# Patient Record
Sex: Female | Born: 1965 | Race: White | Hispanic: No | Smoking: Never smoker
Health system: Southern US, Community
[De-identification: ages and names within clinical notes are randomized; demographics above are authoritative.]

## PROBLEM LIST (undated history)

## (undated) DIAGNOSIS — F419 Anxiety disorder, unspecified: Secondary | ICD-10-CM

## (undated) DIAGNOSIS — E611 Iron deficiency: Secondary | ICD-10-CM

## (undated) DIAGNOSIS — F410 Panic disorder [episodic paroxysmal anxiety] without agoraphobia: Secondary | ICD-10-CM

---

## 2003-03-07 ENCOUNTER — Emergency Department (HOSPITAL_COMMUNITY): Admission: AD | Admit: 2003-03-07 | Discharge: 2003-03-07 | Payer: Self-pay | Admitting: Family Medicine

## 2003-06-26 ENCOUNTER — Emergency Department (HOSPITAL_COMMUNITY): Admission: EM | Admit: 2003-06-26 | Discharge: 2003-06-26 | Payer: Self-pay | Admitting: *Deleted

## 2003-11-14 ENCOUNTER — Encounter: Admission: RE | Admit: 2003-11-14 | Discharge: 2003-11-14 | Payer: Self-pay | Admitting: Family Medicine

## 2004-08-19 ENCOUNTER — Other Ambulatory Visit: Admission: RE | Admit: 2004-08-19 | Discharge: 2004-08-19 | Payer: Self-pay | Admitting: Obstetrics and Gynecology

## 2004-12-24 ENCOUNTER — Encounter: Admission: RE | Admit: 2004-12-24 | Discharge: 2004-12-24 | Payer: Self-pay | Admitting: Family Medicine

## 2006-01-27 ENCOUNTER — Emergency Department (HOSPITAL_COMMUNITY): Admission: EM | Admit: 2006-01-27 | Discharge: 2006-01-27 | Payer: Self-pay | Admitting: Emergency Medicine

## 2006-03-20 ENCOUNTER — Emergency Department (HOSPITAL_COMMUNITY): Admission: EM | Admit: 2006-03-20 | Discharge: 2006-03-20 | Payer: Self-pay | Admitting: Emergency Medicine

## 2006-04-05 ENCOUNTER — Emergency Department (HOSPITAL_COMMUNITY): Admission: EM | Admit: 2006-04-05 | Discharge: 2006-04-05 | Payer: Self-pay | Admitting: Emergency Medicine

## 2006-07-24 ENCOUNTER — Emergency Department (HOSPITAL_COMMUNITY): Admission: EM | Admit: 2006-07-24 | Discharge: 2006-07-24 | Payer: Self-pay | Admitting: Family Medicine

## 2007-06-23 ENCOUNTER — Emergency Department (HOSPITAL_COMMUNITY): Admission: EM | Admit: 2007-06-23 | Discharge: 2007-06-23 | Payer: Self-pay | Admitting: Emergency Medicine

## 2007-08-21 ENCOUNTER — Emergency Department (HOSPITAL_COMMUNITY): Admission: EM | Admit: 2007-08-21 | Discharge: 2007-08-21 | Payer: Self-pay | Admitting: Emergency Medicine

## 2007-09-04 ENCOUNTER — Ambulatory Visit: Payer: Self-pay | Admitting: Family Medicine

## 2007-10-15 ENCOUNTER — Emergency Department (HOSPITAL_COMMUNITY): Admission: EM | Admit: 2007-10-15 | Discharge: 2007-10-15 | Payer: Self-pay | Admitting: Family Medicine

## 2007-12-10 ENCOUNTER — Emergency Department (HOSPITAL_COMMUNITY): Admission: EM | Admit: 2007-12-10 | Discharge: 2007-12-10 | Payer: Self-pay | Admitting: Family Medicine

## 2008-02-03 ENCOUNTER — Emergency Department (HOSPITAL_COMMUNITY): Admission: EM | Admit: 2008-02-03 | Discharge: 2008-02-03 | Payer: Self-pay | Admitting: Emergency Medicine

## 2008-06-17 ENCOUNTER — Emergency Department (HOSPITAL_COMMUNITY): Admission: EM | Admit: 2008-06-17 | Discharge: 2008-06-17 | Payer: Self-pay | Admitting: Emergency Medicine

## 2008-06-29 ENCOUNTER — Emergency Department (HOSPITAL_COMMUNITY): Admission: EM | Admit: 2008-06-29 | Discharge: 2008-06-29 | Payer: Self-pay | Admitting: Emergency Medicine

## 2008-07-10 ENCOUNTER — Emergency Department (HOSPITAL_COMMUNITY): Admission: EM | Admit: 2008-07-10 | Discharge: 2008-07-10 | Payer: Self-pay | Admitting: Emergency Medicine

## 2008-07-22 ENCOUNTER — Emergency Department (HOSPITAL_COMMUNITY): Admission: EM | Admit: 2008-07-22 | Discharge: 2008-07-23 | Payer: Self-pay | Admitting: Emergency Medicine

## 2008-07-28 ENCOUNTER — Inpatient Hospital Stay (HOSPITAL_COMMUNITY): Admission: AD | Admit: 2008-07-28 | Discharge: 2008-07-28 | Payer: Self-pay | Admitting: Obstetrics & Gynecology

## 2008-08-04 ENCOUNTER — Inpatient Hospital Stay (HOSPITAL_COMMUNITY): Admission: AD | Admit: 2008-08-04 | Discharge: 2008-08-04 | Payer: Self-pay | Admitting: Obstetrics & Gynecology

## 2008-08-21 ENCOUNTER — Encounter: Payer: Self-pay | Admitting: Obstetrics & Gynecology

## 2008-08-21 ENCOUNTER — Ambulatory Visit: Payer: Self-pay | Admitting: Obstetrics & Gynecology

## 2008-08-22 ENCOUNTER — Encounter: Payer: Self-pay | Admitting: Obstetrics & Gynecology

## 2008-08-22 LAB — CONVERTED CEMR LAB: hCG, Beta Chain, Quant, S: 6.7 milliintl units/mL

## 2008-08-26 ENCOUNTER — Emergency Department (HOSPITAL_COMMUNITY): Admission: EM | Admit: 2008-08-26 | Discharge: 2008-08-26 | Payer: Self-pay | Admitting: Emergency Medicine

## 2008-09-01 ENCOUNTER — Ambulatory Visit: Payer: Self-pay | Admitting: Obstetrics & Gynecology

## 2008-09-01 LAB — CONVERTED CEMR LAB: hCG, Beta Chain, Quant, S: 2 milliintl units/mL

## 2008-11-21 ENCOUNTER — Emergency Department (HOSPITAL_COMMUNITY): Admission: EM | Admit: 2008-11-21 | Discharge: 2008-11-21 | Payer: Self-pay | Admitting: Emergency Medicine

## 2009-06-17 ENCOUNTER — Emergency Department (HOSPITAL_COMMUNITY): Admission: EM | Admit: 2009-06-17 | Discharge: 2009-06-17 | Payer: Self-pay | Admitting: Family Medicine

## 2009-12-30 ENCOUNTER — Emergency Department (HOSPITAL_COMMUNITY): Admission: EM | Admit: 2009-12-30 | Discharge: 2009-12-30 | Payer: Self-pay | Admitting: Emergency Medicine

## 2010-03-08 ENCOUNTER — Encounter: Payer: Self-pay | Admitting: Family Medicine

## 2010-04-27 LAB — DIFFERENTIAL
Basophils Absolute: 0 10*3/uL (ref 0.0–0.1)
Basophils Relative: 0 % (ref 0–1)
Eosinophils Absolute: 0.1 10*3/uL (ref 0.0–0.7)
Eosinophils Relative: 1 % (ref 0–5)
Lymphocytes Relative: 41 % (ref 12–46)
Lymphs Abs: 3.5 10*3/uL (ref 0.7–4.0)
Monocytes Absolute: 0.4 10*3/uL (ref 0.1–1.0)
Monocytes Relative: 4 % (ref 3–12)
Neutro Abs: 4.5 10*3/uL (ref 1.7–7.7)
Neutrophils Relative %: 53 % (ref 43–77)

## 2010-04-27 LAB — CBC
HCT: 39.9 % (ref 36.0–46.0)
Hemoglobin: 13 g/dL (ref 12.0–15.0)
MCH: 25.9 pg — ABNORMAL LOW (ref 26.0–34.0)
MCHC: 32.6 g/dL (ref 30.0–36.0)
MCV: 79.5 fL (ref 78.0–100.0)
Platelets: 258 10*3/uL (ref 150–400)
RBC: 5.02 MIL/uL (ref 3.87–5.11)
RDW: 13.7 % (ref 11.5–15.5)
WBC: 8.6 10*3/uL (ref 4.0–10.5)

## 2010-04-27 LAB — BASIC METABOLIC PANEL
BUN: 11 mg/dL (ref 6–23)
CO2: 26 mEq/L (ref 19–32)
Calcium: 9.4 mg/dL (ref 8.4–10.5)
Chloride: 102 mEq/L (ref 96–112)
Creatinine, Ser: 0.74 mg/dL (ref 0.4–1.2)
GFR calc Af Amer: >60 mL/min (ref >60)
GFR calc non Af Amer: >60 mL/min (ref >60)
Glucose, Bld: 96 mg/dL (ref 70–99)
Potassium: 4.7 mEq/L (ref 3.5–5.1)
Sodium: 136 mEq/L (ref 135–145)

## 2010-04-27 LAB — URINALYSIS, ROUTINE W REFLEX MICROSCOPIC
Bilirubin Urine: NEGATIVE
Glucose, UA: NEGATIVE mg/dL
Hgb urine dipstick: NEGATIVE
Ketones, ur: NEGATIVE mg/dL
Nitrite: NEGATIVE
Protein, ur: NEGATIVE mg/dL
Specific Gravity, Urine: 1.023 (ref 1.005–1.030)
Urobilinogen, UA: 0.2 mg/dL (ref 0.0–1.0)
pH: 6 (ref 5.0–8.0)

## 2010-04-27 LAB — LIPASE, BLOOD: Lipase: 21 U/L (ref 11–59)

## 2010-04-27 LAB — PREGNANCY, URINE: Preg Test, Ur: NEGATIVE

## 2010-04-27 LAB — HEPATIC FUNCTION PANEL
ALT: 32 U/L (ref 0–35)
AST: 42 U/L — ABNORMAL HIGH (ref 0–37)
Albumin: 3.8 g/dL (ref 3.5–5.2)
Alkaline Phosphatase: 67 U/L (ref 39–117)
Bilirubin, Direct: 0.4 mg/dL — ABNORMAL HIGH (ref 0.0–0.3)
Indirect Bilirubin: 0.4 mg/dL (ref 0.3–0.9)
Total Bilirubin: 0.8 mg/dL (ref 0.3–1.2)
Total Protein: 7.3 g/dL (ref 6.0–8.3)

## 2010-05-04 LAB — POCT I-STAT, CHEM 8
BUN: 17 mg/dL (ref 6–23)
Calcium, Ion: 1.15 mmol/L (ref 1.12–1.32)
Chloride: 104 mEq/L (ref 96–112)
Creatinine, Ser: 0.6 mg/dL (ref 0.4–1.2)
Glucose, Bld: 104 mg/dL — ABNORMAL HIGH (ref 70–99)
HCT: 40 % (ref 36.0–46.0)
Hemoglobin: 13.6 g/dL (ref 12.0–15.0)
Potassium: 4.5 mEq/L (ref 3.5–5.1)
Sodium: 138 mEq/L (ref 135–145)
TCO2: 27 mmol/L (ref 0–100)

## 2010-05-04 LAB — POCT PREGNANCY, URINE: Preg Test, Ur: NEGATIVE

## 2010-05-24 LAB — URINALYSIS, ROUTINE W REFLEX MICROSCOPIC
Bilirubin Urine: NEGATIVE
Glucose, UA: NEGATIVE mg/dL
Hgb urine dipstick: NEGATIVE
Ketones, ur: NEGATIVE mg/dL
Nitrite: NEGATIVE
Protein, ur: NEGATIVE mg/dL
Specific Gravity, Urine: 1.031 — ABNORMAL HIGH (ref 1.005–1.030)
Urobilinogen, UA: 1 mg/dL (ref 0.0–1.0)
pH: 6 (ref 5.0–8.0)

## 2010-05-24 LAB — CBC
HCT: 35.2 % — ABNORMAL LOW (ref 36.0–46.0)
Hemoglobin: 11.4 g/dL — ABNORMAL LOW (ref 12.0–15.0)
MCHC: 32.5 g/dL (ref 30.0–36.0)
MCV: 82.4 fL (ref 78.0–100.0)
Platelets: 235 10*3/uL (ref 150–400)
RBC: 4.27 MIL/uL (ref 3.87–5.11)
RDW: 13.3 % (ref 11.5–15.5)
WBC: 7.5 10*3/uL (ref 4.0–10.5)

## 2010-05-24 LAB — HCG, QUANTITATIVE, PREGNANCY
hCG, Beta Chain, Quant, S: 49 m[IU]/mL — ABNORMAL HIGH (ref <5)
hCG, Beta Chain, Quant, S: 275 m[IU]/mL — ABNORMAL HIGH (ref <5)
hCG, Beta Chain, Quant, S: 116 m[IU]/mL — ABNORMAL HIGH (ref <5)

## 2010-05-25 LAB — POCT I-STAT, CHEM 8
BUN: 12 mg/dL (ref 6–23)
Calcium, Ion: 1.16 mmol/L (ref 1.12–1.32)
Creatinine, Ser: 0.9 mg/dL (ref 0.4–1.2)
Glucose, Bld: 127 mg/dL — ABNORMAL HIGH (ref 70–99)
Hemoglobin: 11.6 g/dL — ABNORMAL LOW (ref 12.0–15.0)
Sodium: 140 mEq/L (ref 135–145)
TCO2: 26 mmol/L (ref 0–100)

## 2010-05-25 LAB — URINALYSIS, ROUTINE W REFLEX MICROSCOPIC
Bilirubin Urine: NEGATIVE
Bilirubin Urine: NEGATIVE
Glucose, UA: NEGATIVE mg/dL
Glucose, UA: NEGATIVE mg/dL
Hgb urine dipstick: NEGATIVE
Ketones, ur: NEGATIVE mg/dL
Ketones, ur: NEGATIVE mg/dL
Nitrite: NEGATIVE
pH: 6.5 (ref 5.0–8.0)

## 2010-05-25 LAB — GC/CHLAMYDIA PROBE AMP, GENITAL
Chlamydia, DNA Probe: NEGATIVE
GC Probe Amp, Genital: NEGATIVE

## 2010-05-25 LAB — URINE MICROSCOPIC-ADD ON

## 2010-05-25 LAB — HCG, QUANTITATIVE, PREGNANCY: hCG, Beta Chain, Quant, S: 1460 m[IU]/mL — ABNORMAL HIGH (ref <5)

## 2010-05-25 LAB — POCT PREGNANCY, URINE: Preg Test, Ur: POSITIVE

## 2010-05-25 LAB — WET PREP, GENITAL
Trich, Wet Prep: NONE SEEN
Yeast Wet Prep HPF POC: NONE SEEN

## 2010-05-25 LAB — POCT CARDIAC MARKERS: Myoglobin, poc: 29.3 ng/mL (ref 12–200)

## 2010-05-25 LAB — ABO/RH: ABO/RH(D): A POS

## 2010-06-29 NOTE — Group Therapy Note (Signed)
NAME:  Rebecca Casey, NEISWONGER NO.:  [id number]   MEDICAL RECORD NO.:  [id number]          PATIENT TYPE:  WOC   LOCATION:  [facility]                   FACILITY:  [facility]   PHYSICIAN:  Jaynie Collins, MD     DATE OF BIRTH:  [birth year]   DATE OF SERVICE:  08/21/2008                                  CLINIC NOTE   CHIEF COMPLAINT:  Annual examination.   FOLLOWUP:  SAB.   HISTORY OF PRESENT ILLNESS:  The patient is a 45 year old gravida 3,  para 1-0-2-1 with a recent SAB whose last follow up at [facility] was on August 04, 2008.  Of note, during evaluation for this SAB, she had a probable  intrauterine gestational sac in the lower uterine segment but no yolk  sac or fetal poles were seen.  As such, she was serially followed with  HCG values.  Her beta HCG on August 04, 2008 was 49.  She was told to  follow up here for another lab test and also for her annual examination.  The patient denies any concerns.   PAST OB/GYN HISTORY:  One vaginal delivery, two miscarriages.   MENSTRUAL HISTORY:  Is remarkable for menarche at age 26 and regular  menstrual cycles with 30 days in between cycles and her periods last 3  days with medium flow associated with moderate pain.  The patient denies  abnormal Pap smears or sexually transmitted infections.  She is  currently not on any contraception.   PAST MEDICAL HISTORY:  Substance abuse.   PAST SURGICAL HISTORY:  None.   MEDICATIONS:  None.   ALLERGIES:  NO KNOWN DRUG ALLERGIES.   SOCIAL HISTORY:  The patient is currently in [facility] for  addition to crack and cocaine.  She does not smoke, drink alcohol or use  any illicit drugs currently.  She denies any current or past history of  sexual or physical abuse.   FAMILY HISTORY:  Is remarkable for various forms of cancer.  Her father  passed away with complications of colorectal cancer.  Her mother,  grandmother and great-grandmother had breast cancer, two sisters were  diagnosed with  skin cancer.  There is also a history of Crohn's colitis  for one of her sisters.  The patient is unsure if any genetics  evaluation has been done.  There is also a family history of high blood  pressure.   REVIEW OF SYSTEMS:  The patient endorses weight gain.   PHYSICAL EXAMINATION:  VITAL SIGNS:  Temperature 98, pulse 81, blood  pressure 102/70, respirations 20, weight 167 pounds.  GENERAL:  In no apparent distress.  HEENT:  Normocephalic, atraumatic.  NECK:  Supple.  Normal thyroid.  BREASTS:  Symmetric in size.  No abnormal masses, skin changes,  tenderness on lymphadenopathy.  LUNGS:  Clear to auscultation bilaterally.  HEART:  Regular rate and rhythm.  ABDOMEN:  Soft, nontender, nondistended.  EXTREMITIES:  No clubbing, cyanosis or edema.  PELVIC:  Normal external female genitalia.  Pink, well rugated vagina.  Multiparous cervical os.  Pap smear was obtained.  A small amount of old  blood visualized  on the external cervical os.  On bimanual exam, small  normal-sized uterus, mobile, no adnexal or uterine tenderness.  Normal  adnexa bilaterally.   ASSESSMENT/PLAN:  The patient is a 45 year old gravida 3, para 1-0-2-1  here for annual exam.  The patient is also here for spontaneous abortion  follow up.  She had a normal breast examination today and given her  extensive family history of breast cancer, will be scheduled for  mammogram at the end of this visit.  Pap smear will also be followed up  and the patient will be contacted with the results.  Given that her beta  HCGs were not followed down to where they were undetectable, another  level will be drawn today and the patient will also be called with the  results.  The patient was told to return to the gynecologic clinic for  any further gynecologic concerns.           ______________________________  Jaynie Collins, MD     UA/MEDQ  D:  08/21/2008  T:  08/21/2008  Job:  [id number]

## 2010-11-19 LAB — POCT I-STAT, CHEM 8
BUN: 15 mg/dL (ref 6–23)
Calcium, Ion: 1.21 mmol/L (ref 1.12–1.32)
Creatinine, Ser: 0.9 mg/dL (ref 0.4–1.2)
Sodium: 143 mEq/L (ref 135–145)
TCO2: 26 mmol/L (ref 0–100)

## 2010-11-19 LAB — URINALYSIS, ROUTINE W REFLEX MICROSCOPIC
Glucose, UA: NEGATIVE mg/dL
Protein, ur: NEGATIVE mg/dL
Specific Gravity, Urine: 1.025 (ref 1.005–1.030)

## 2010-11-19 LAB — DIFFERENTIAL
Basophils Relative: 1 % (ref 0–1)
Eosinophils Absolute: 0.1 10*3/uL (ref 0.0–0.7)
Eosinophils Relative: 1 % (ref 0–5)
Monocytes Absolute: 0.3 10*3/uL (ref 0.1–1.0)
Monocytes Relative: 5 % (ref 3–12)

## 2010-11-19 LAB — PREGNANCY, URINE: Preg Test, Ur: NEGATIVE

## 2010-11-19 LAB — COMPREHENSIVE METABOLIC PANEL
ALT: 13 U/L (ref 0–35)
AST: 17 U/L (ref 0–37)
Albumin: 3.1 g/dL — ABNORMAL LOW (ref 3.5–5.2)
Alkaline Phosphatase: 41 U/L (ref 39–117)
Potassium: 4 mEq/L (ref 3.5–5.1)
Sodium: 143 mEq/L (ref 135–145)
Total Protein: 5.6 g/dL — ABNORMAL LOW (ref 6.0–8.3)

## 2010-11-19 LAB — RAPID URINE DRUG SCREEN, HOSP PERFORMED
Benzodiazepines: NOT DETECTED
Cocaine: POSITIVE — AB
Tetrahydrocannabinol: NOT DETECTED

## 2010-11-19 LAB — POCT CARDIAC MARKERS
Myoglobin, poc: 33.3 ng/mL (ref 12–200)
Myoglobin, poc: 57.1 ng/mL (ref 12–200)

## 2010-11-19 LAB — CBC
Hemoglobin: 11.9 g/dL — ABNORMAL LOW (ref 12.0–15.0)
Platelets: 213 10*3/uL (ref 150–400)
RDW: 13.6 % (ref 11.5–15.5)
WBC: 6.7 10*3/uL (ref 4.0–10.5)

## 2010-11-19 LAB — URINE CULTURE

## 2010-11-19 LAB — ETHANOL: Alcohol, Ethyl (B): <5 mg/dL (ref 0–10)

## 2011-07-12 IMAGING — CT CT ABD-PELV W/ CM
2 of 5 series · 17 of 46 positions shown, 19 images · IV contrast (CONTRAST)
Comparison: None

CLINICAL DATA: Severe right flank and right upper quadrant pain.
Nausea, vomiting.

CT ABDOMEN AND PELVIS WITH CONTRAST
TECHNIQUE: Multidetector CT imaging of the abdomen and pelvis was
performed following the standard protocol during bolus
administration of intravenous contrast.
Contrast: 100 ml Omnipaque 300 IV.

[Series 2: routine · axial · 0.73mm/px · z∈[-236,+159]mm · 14 of 91 slices shown, 16 images]
[im 6/91  soft-tissue]
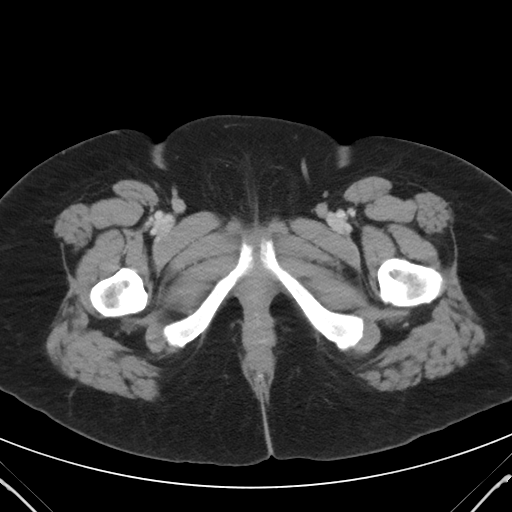
[im 6/91  bone]
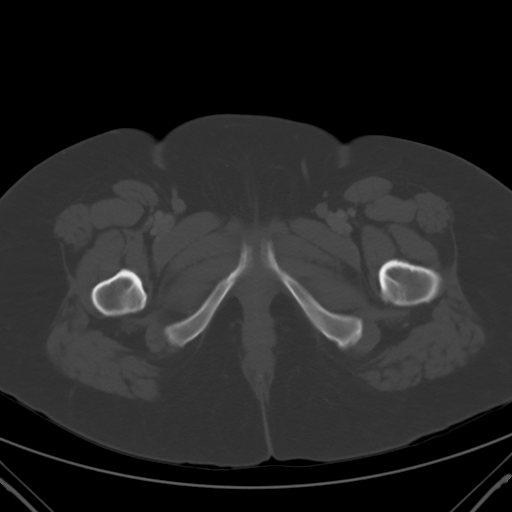
[im 12/91  soft-tissue]
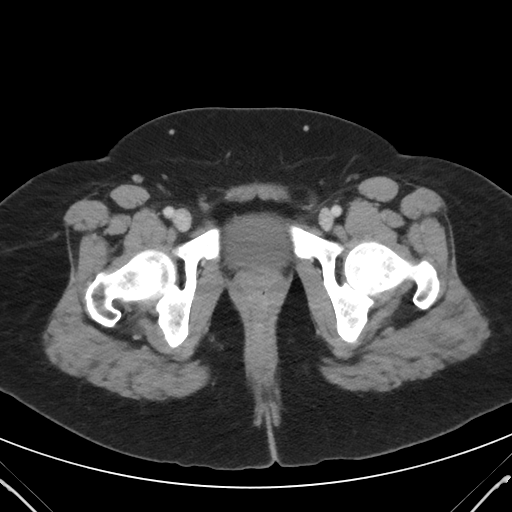
[im 17/91  soft-tissue]
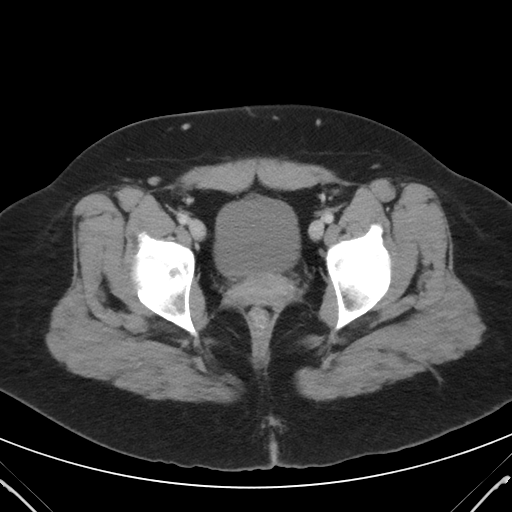
[im 23/91  soft-tissue]
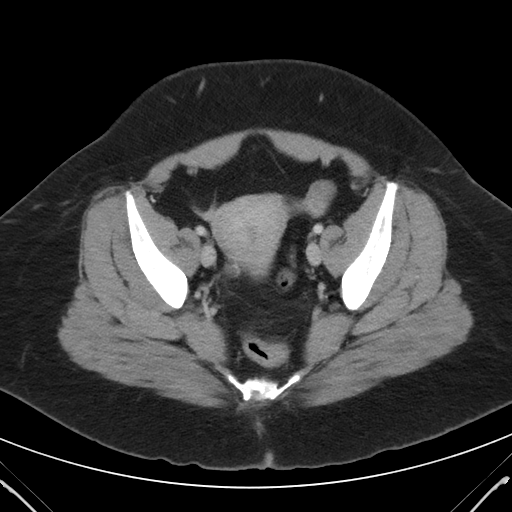
[im 29/91  soft-tissue]
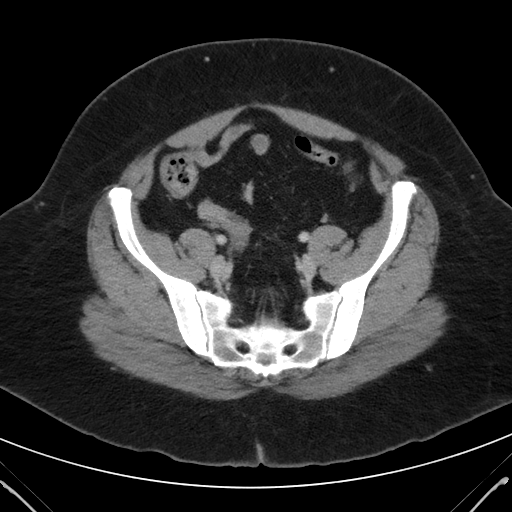
[im 34/91  soft-tissue]
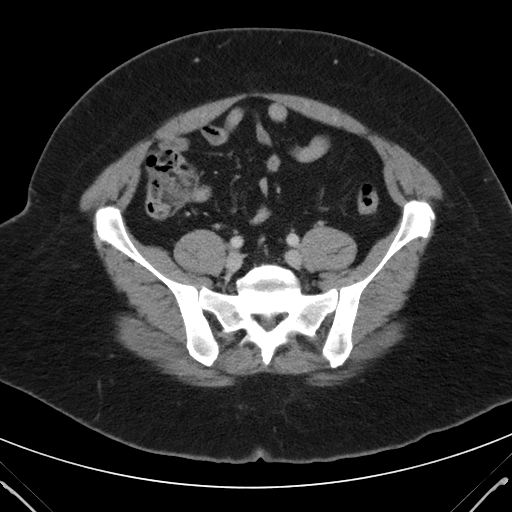
[im 40/91  soft-tissue]
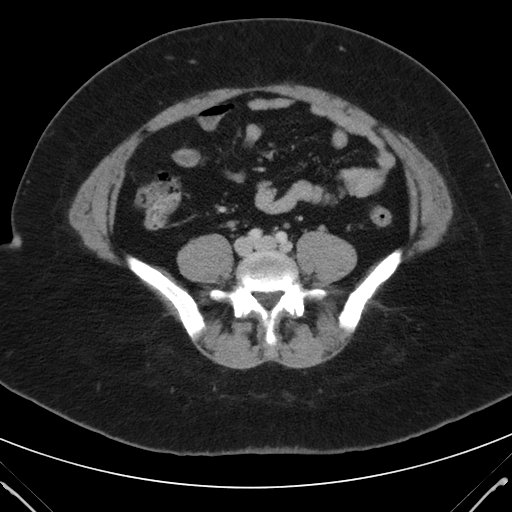
[im 51/91  soft-tissue]
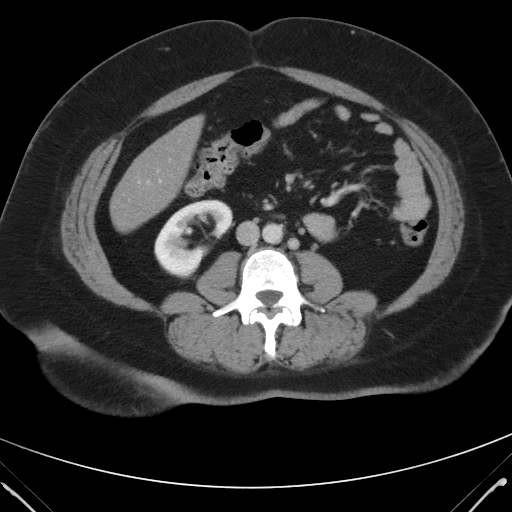
[im 57/91  soft-tissue]
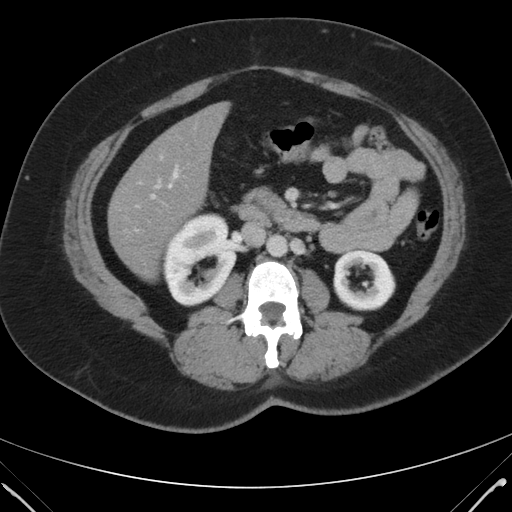
[im 57/91  bone]
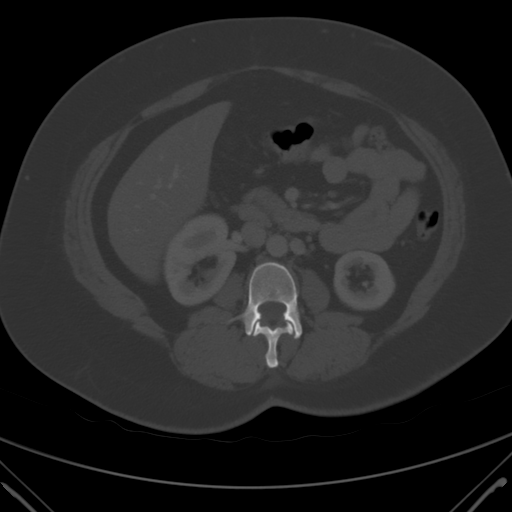
[im 62/91  soft-tissue]
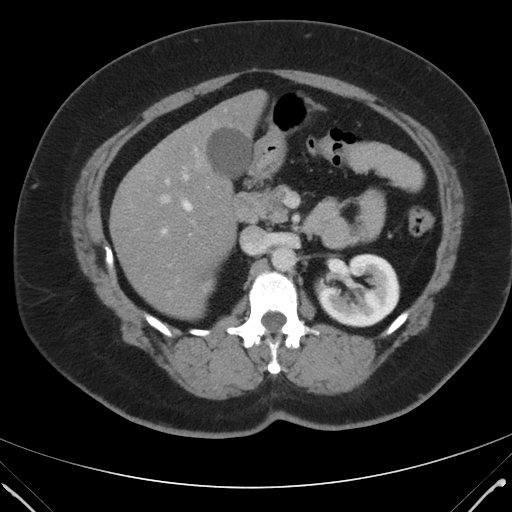
[im 68/91  soft-tissue]
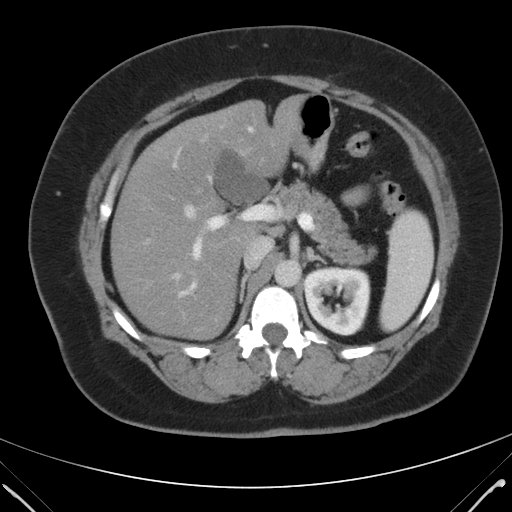
[im 74/91  soft-tissue]
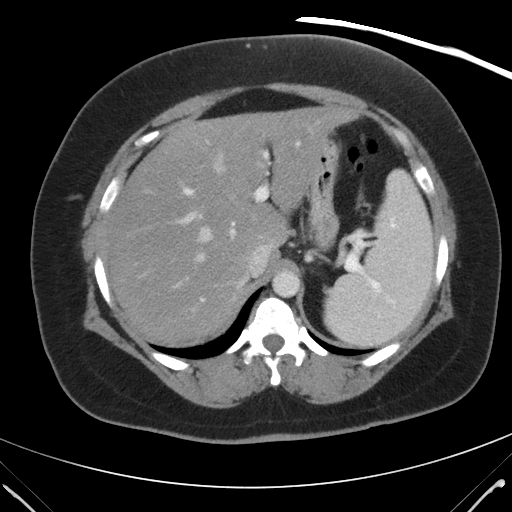
[im 79/91  soft-tissue]
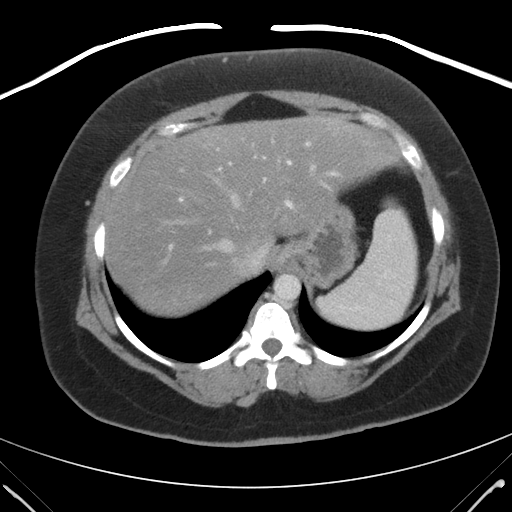
[im 85/91  soft-tissue]
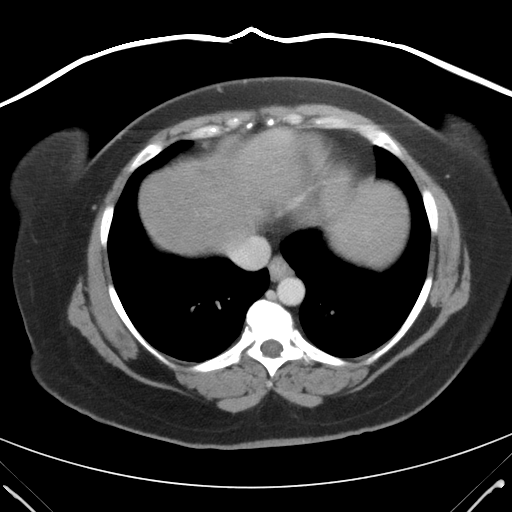

[mpr, coronals, coronal · coronal · 0.88mm/px · 3 of 128 slices shown]
[im 43/128  soft-tissue]
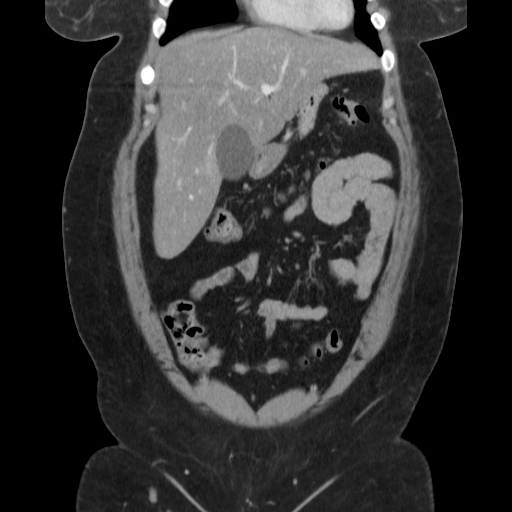
[im 57/128  soft-tissue]
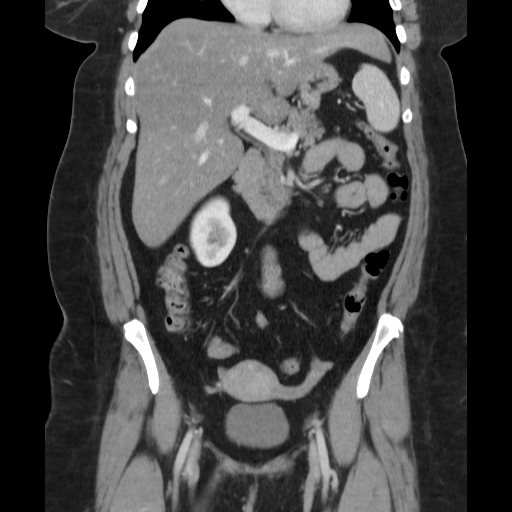
[im 71/128  soft-tissue]
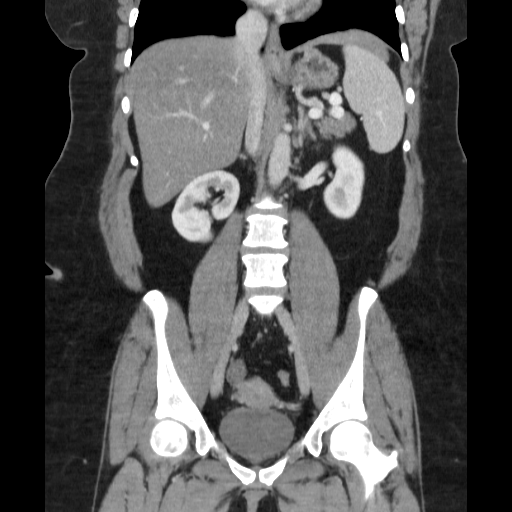

[17 of 46 positions shown; findings below may reference images not displayed]

FINDINGS: Lung bases are clear.  No effusions.  Heart is normal
size.

There is fatty infiltration of the liver.  No focal lesion.
Gallbladder, spleen, pancreas, adrenals and kidneys are
unremarkable.  The appendix is visualized and is normal. Bowel
grossly unremarkable.  No free fluid, free air, or adenopathy.
Uterus, adnexa and urinary bladder unremarkable.

No acute bony abnormality.
IMPRESSION: Fatty infiltration of the liver.

No acute findings in the abdomen or pelvis.

## 2012-08-31 ENCOUNTER — Other Ambulatory Visit: Payer: Self-pay

## 2012-08-31 DIAGNOSIS — Z1231 Encounter for screening mammogram for malignant neoplasm of breast: Secondary | ICD-10-CM

## 2012-09-12 ENCOUNTER — Telehealth: Payer: Self-pay | Admitting: *Deleted
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
## 2012-09-12 NOTE — Telephone Encounter (Signed)
Ok that sounds reasonable.

## 2012-09-21 ENCOUNTER — Ambulatory Visit
Admission: RE | Admit: 2012-09-21 | Discharge: 2012-09-21 | Disposition: A | Discharge status: Home / Self Care | Payer: Medicaid Other | Source: Ambulatory Visit

## 2012-09-21 DIAGNOSIS — Z1231 Encounter for screening mammogram for malignant neoplasm of breast: Secondary | ICD-10-CM

## 2012-09-25 ENCOUNTER — Other Ambulatory Visit: Payer: Self-pay | Admitting: Internal Medicine

## 2012-09-25 DIAGNOSIS — R928 Other abnormal and inconclusive findings on diagnostic imaging of breast: Secondary | ICD-10-CM

## 2012-10-12 ENCOUNTER — Other Ambulatory Visit: Payer: Medicaid Other

## 2012-10-22 ENCOUNTER — Ambulatory Visit: Payer: Self-pay | Admitting: Infectious Disease

## 2012-11-27 ENCOUNTER — Other Ambulatory Visit: Payer: Self-pay | Admitting: Internal Medicine

## 2012-11-27 DIAGNOSIS — R928 Other abnormal and inconclusive findings on diagnostic imaging of breast: Secondary | ICD-10-CM

## 2012-12-11 ENCOUNTER — Other Ambulatory Visit: Payer: Medicaid Other

## 2012-12-20 ENCOUNTER — Ambulatory Visit
Admission: RE | Admit: 2012-12-20 | Discharge: 2012-12-20 | Disposition: A | Discharge status: Home / Self Care | Payer: Medicaid Other | Source: Ambulatory Visit | Attending: Internal Medicine | Admitting: Internal Medicine

## 2012-12-20 ENCOUNTER — Other Ambulatory Visit: Payer: Self-pay | Admitting: Internal Medicine

## 2012-12-20 DIAGNOSIS — R928 Other abnormal and inconclusive findings on diagnostic imaging of breast: Secondary | ICD-10-CM

## 2013-05-06 ENCOUNTER — Emergency Department (HOSPITAL_COMMUNITY)
Admission: EM | Admit: 2013-05-06 | Discharge: 2013-05-07 | Disposition: A | Discharge status: Home / Self Care | Payer: Medicaid Other | Attending: Emergency Medicine | Admitting: Emergency Medicine

## 2013-05-06 ENCOUNTER — Encounter (HOSPITAL_COMMUNITY): Payer: Self-pay | Admitting: Emergency Medicine

## 2013-05-06 ENCOUNTER — Emergency Department (HOSPITAL_COMMUNITY): Payer: Medicaid Other

## 2013-05-06 DIAGNOSIS — R071 Chest pain on breathing: Secondary | ICD-10-CM | POA: Insufficient documentation

## 2013-05-06 DIAGNOSIS — R0789 Other chest pain: Secondary | ICD-10-CM

## 2013-05-06 DIAGNOSIS — F329 Major depressive disorder, single episode, unspecified: Secondary | ICD-10-CM

## 2013-05-06 DIAGNOSIS — F419 Anxiety disorder, unspecified: Secondary | ICD-10-CM

## 2013-05-06 DIAGNOSIS — F410 Panic disorder [episodic paroxysmal anxiety] without agoraphobia: Secondary | ICD-10-CM | POA: Insufficient documentation

## 2013-05-06 DIAGNOSIS — M25559 Pain in unspecified hip: Secondary | ICD-10-CM | POA: Insufficient documentation

## 2013-05-06 DIAGNOSIS — F3289 Other specified depressive episodes: Secondary | ICD-10-CM | POA: Insufficient documentation

## 2013-05-06 DIAGNOSIS — D509 Iron deficiency anemia, unspecified: Secondary | ICD-10-CM | POA: Insufficient documentation

## 2013-05-06 DIAGNOSIS — F32A Depression, unspecified: Secondary | ICD-10-CM

## 2013-05-06 HISTORY — DX: Iron deficiency: E61.1

## 2013-05-06 HISTORY — DX: Anxiety disorder, unspecified: F41.9

## 2013-05-06 HISTORY — DX: Panic disorder (episodic paroxysmal anxiety): F41.0

## 2013-05-06 LAB — CBC
HCT: 38.7 % (ref 36.0–46.0)
HEMOGLOBIN: 12.7 g/dL (ref 12.0–15.0)
MCH: 27.2 pg (ref 26.0–34.0)
MCHC: 32.8 g/dL (ref 30.0–36.0)
MCV: 82.9 fL (ref 78.0–100.0)
PLATELETS: 251 10*3/uL (ref 150–400)
RBC: 4.67 MIL/uL (ref 3.87–5.11)
RDW: 13.2 % (ref 11.5–15.5)
WBC: 8.4 10*3/uL (ref 4.0–10.5)

## 2013-05-06 LAB — I-STAT TROPONIN, ED: TROPONIN I, POC: 0 ng/mL (ref 0.00–0.08)

## 2013-05-06 LAB — BASIC METABOLIC PANEL
BUN: 12 mg/dL (ref 6–23)
CHLORIDE: 103 meq/L (ref 96–112)
CO2: 28 meq/L (ref 19–32)
Calcium: 9.5 mg/dL (ref 8.4–10.5)
Creatinine, Ser: 0.7 mg/dL (ref 0.50–1.10)
GFR calc Af Amer: >90 mL/min (ref >90)
GFR calc non Af Amer: >90 mL/min (ref >90)
GLUCOSE: 113 mg/dL — AB (ref 70–99)
POTASSIUM: 5 meq/L (ref 3.7–5.3)
SODIUM: 141 meq/L (ref 137–147)

## 2013-05-06 NOTE — ED Notes (Signed)
Pt updated on wait time.  Agreed to wait.

## 2013-05-06 NOTE — ED Notes (Signed)
Pt c/o bilateral hip/thigh pain X 1 month, sts the pain is getting worse and it causes her to become anxious and today she started to have an anxiety attack that caused CP. Pt tearful in triage, sts she has a lot of stress going on in her life right now and just can't handle it. Pt reports she can only eat every 2 days because she is so stressed. Denies difficulty ambulating. Denies SI/HI. Nad, skin warm and dry, resp e/u.

## 2013-05-07 MED ORDER — NAPROXEN 500 MG PO TABS: 500.0000 mg | ORAL_TABLET | Freq: Two times a day (BID) | ORAL | Status: AC

## 2013-05-07 MED ORDER — NAPROXEN 250 MG PO TABS
500.0000 mg | ORAL_TABLET | Freq: Once | ORAL | Status: AC
  Administered 2013-05-07: 500 mg via ORAL
  Filled 2013-05-07: qty 2

## 2013-05-07 MED ORDER — LORAZEPAM 1 MG PO TABS: 1.0000 mg | ORAL_TABLET | Freq: Three times a day (TID) | ORAL | Status: AC | PRN

## 2013-05-07 NOTE — Discharge Instructions (Signed)
It is extremely important for you to followup with a local mental health provider to help with your ongoing symptoms.  Starting on a long-term medication will help with many of your symptoms.   Arthralgia Your caregiver has diagnosed you as suffering from an arthralgia. Arthralgia means there is pain in a joint. This can come from many reasons including:  Bruising the joint which causes soreness (inflammation) in the joint.  Wear and tear on the joints which occur as we grow older (osteoarthritis).  Overusing the joint.  Various forms of arthritis.  Infections of the joint. Regardless of the cause of pain in your joint, most of these different pains respond to anti-inflammatory drugs and rest. The exception to this is when a joint is infected, and these cases are treated with antibiotics, if it is a bacterial infection. HOME CARE INSTRUCTIONS   Rest the injured area for as long as directed by your caregiver. Then slowly start using the joint as directed by your caregiver and as the pain allows. Crutches as directed may be useful if the ankles, knees or hips are involved. If the knee was splinted or casted, continue use and care as directed. If an stretchy or elastic wrapping bandage has been applied today, it should be removed and re-applied every 3 to 4 hours. It should not be applied tightly, but firmly enough to keep swelling down. Watch toes and feet for swelling, bluish discoloration, coldness, numbness or excessive pain. If any of these problems (symptoms) occur, remove the ace bandage and re-apply more loosely. If these symptoms persist, contact your caregiver or return to this location.  For the first 24 hours, keep the injured extremity elevated on pillows while lying down.  Apply ice for 15-20 minutes to the sore joint every couple hours while awake for the first half day. Then 03-04 times per day for the first 48 hours. Put the ice in a plastic bag and place a towel between the bag  of ice and your skin.  Wear any splinting, casting, elastic bandage applications, or slings as instructed.  Only take over-the-counter or prescription medicines for pain, discomfort, or fever as directed by your caregiver. Do not use aspirin immediately after the injury unless instructed by your physician. Aspirin can cause increased bleeding and bruising of the tissues.  If you were given crutches, continue to use them as instructed and do not resume weight bearing on the sore joint until instructed. Persistent pain and inability to use the sore joint as directed for more than 2 to 3 days are warning signs indicating that you should see a caregiver for a follow-up visit as soon as possible. Initially, a hairline fracture (break in bone) may not be evident on X-rays. Persistent pain and swelling indicate that further evaluation, non-weight bearing or use of the joint (use of crutches or slings as instructed), or further X-rays are indicated. X-rays may sometimes not show a small fracture until a week or 10 days later. Make a follow-up appointment with your own caregiver or one to whom we have referred you. A radiologist (specialist in reading X-rays) may read your X-rays. Make sure you know how you are to obtain your X-ray results. Do not assume everything is normal if you do not hear from us. SEEK MEDICAL CARE IF: Bruising, swelling, or pain increases. SEEK IMMEDIATE MEDICAL CARE IF:   Your fingers or toes are numb or blue.  The pain is not responding to medications and continues to stay the same  or get worse.  The pain in your joint becomes severe.  You develop a fever over 102 F (38.9 C).  It becomes impossible to move or use the joint. MAKE SURE YOU:   Understand these instructions.  Will watch your condition.  Will get help right away if you are not doing well or get worse. Document Released: 01/31/2005 Document Revised: 04/25/2011 Document Reviewed: 09/19/2007 [facility] Patient  Information 2014 [place], [place].  Chest Wall Pain Chest wall pain is pain in or around the bones and muscles of your chest. It may take up to 6 weeks to get better. It may take longer if you must stay physically active in your work and activities.  CAUSES  Chest wall pain may happen on its own. However, it may be caused by:  A viral illness like the flu.  Injury.  Coughing.  Exercise.  Arthritis.  Fibromyalgia.  Shingles. HOME CARE INSTRUCTIONS   Avoid overtiring physical activity. Try not to strain or perform activities that cause pain. This includes any activities using your chest or your abdominal and side muscles, especially if heavy weights are used.  Put ice on the sore area.  Put ice in a plastic bag.  Place a towel between your skin and the bag.  Leave the ice on for 15-20 minutes per hour while awake for the first 2 days.  Only take over-the-counter or prescription medicines for pain, discomfort, or fever as directed by your caregiver. SEEK IMMEDIATE MEDICAL CARE IF:   Your pain increases, or you are very uncomfortable.  You have a fever.  Your chest pain becomes worse.  You have new, unexplained symptoms.  You have nausea or vomiting.  You feel sweaty or lightheaded.  You have a cough with phlegm (sputum), or you cough up blood. MAKE SURE YOU:   Understand these instructions.  Will watch your condition.  Will get help right away if you are not doing well or get worse. Document Released: 01/31/2005 Document Revised: 04/25/2011 Document Reviewed: 09/27/2010 [facility] Patient Information 2014 [place], [place].  Depression, Adult Depression refers to feeling sad, low, down in the dumps, blue, gloomy, or empty. In general, there are two kinds of depression: 1. Depression that we all experience from time to time because of upsetting life experiences, including the loss of a job or the ending of a relationship (normal sadness or normal grief). This kind of  depression is considered normal, is short lived, and resolves within a few days to 2 weeks. (Depression experienced after the loss of a loved one is called bereavement. Bereavement often lasts longer than 2 weeks but normally gets better with time.) 2. Clinical depression, which lasts longer than normal sadness or normal grief or interferes with your ability to function at home, at work, and in school. It also interferes with your personal relationships. It affects almost every aspect of your life. Clinical depression is an illness. Symptoms of depression also can be caused by conditions other than normal sadness and grief or clinical depression. Examples of these conditions are listed as follows:  Physical illness Some physical illnesses, including underactive thyroid gland (hypothyroidism), severe anemia, specific types of cancer, diabetes, uncontrolled seizures, heart and lung problems, strokes, and chronic pain are commonly associated with symptoms of depression.  Side effects of some prescription medicine In some people, certain types of prescription medicine can cause symptoms of depression.  Substance abuse Abuse of alcohol and illicit drugs can cause symptoms of depression. SYMPTOMS Symptoms of normal sadness and  normal grief include the following:  Feeling sad or crying for short periods of time.  Not caring about anything (apathy).  Difficulty sleeping or sleeping too much.  No longer able to enjoy the things you used to enjoy.  Desire to be by oneself all the time (social isolation).  Lack of energy or motivation.  Difficulty concentrating or remembering.  Change in appetite or weight.  Restlessness or agitation. Symptoms of clinical depression include the same symptoms of normal sadness or normal grief and also the following symptoms:  Feeling sad or crying all the time.  Feelings of guilt or worthlessness.  Feelings of hopelessness or helplessness.  Thoughts of  suicide or the desire to harm yourself (suicidal ideation).  Loss of touch with reality (psychotic symptoms). Seeing or hearing things that are not real (hallucinations) or having false beliefs about your life or the people around you (delusions and paranoia). DIAGNOSIS  The diagnosis of clinical depression usually is based on the severity and duration of the symptoms. Your caregiver also will ask you questions about your medical history and substance use to find out if physical illness, use of prescription medicine, or substance abuse is causing your depression. Your caregiver also may order blood tests. TREATMENT  Typically, normal sadness and normal grief do not require treatment. However, sometimes antidepressant medicine is prescribed for bereavement to ease the depressive symptoms until they resolve. The treatment for clinical depression depends on the severity of your symptoms but typically includes antidepressant medicine, counseling with a mental health professional, or a combination of both. Your caregiver will help to determine what treatment is best for you. Depression caused by physical illness usually goes away with appropriate medical treatment of the illness. If prescription medicine is causing depression, talk with your caregiver about stopping the medicine, decreasing the dose, or substituting another medicine. Depression caused by abuse of alcohol or illicit drugs abuse goes away with abstinence from these substances. Some adults need professional help in order to stop drinking or using drugs. SEEK IMMEDIATE CARE IF:  You have thoughts about hurting yourself or others.  You lose touch with reality (have psychotic symptoms).  You are taking medicine for depression and have a serious side effect. FOR MORE INFORMATION National Alliance on Mental Illness: www.nami.[employer] of Mental Health: http://www.maynard.net/ Document Released: 01/29/2000 Document Revised: 08/02/2011  Document Reviewed: 05/02/2011 [facility] Patient Information 2014 [place], [place].  Panic Attacks Panic attacks are sudden, short-livedsurges of severe anxiety, fear, or discomfort. They may occur for no reason when you are relaxed, when you are anxious, or when you are sleeping. Panic attacks may occur for a number of reasons:   Healthy people occasionally have panic attacks in extreme, life-threatening situations, such as war or natural disasters. Normal anxiety is a protective mechanism of the body that helps [place] react to danger (fight or flight response).  Panic attacks are often seen with anxiety disorders, such as panic disorder, social anxiety disorder, generalized anxiety disorder, and phobias. Anxiety disorders cause excessive or uncontrollable anxiety. They may interfere with your relationships or other life activities.  Panic attacks are sometimes seen with other mental illnesses such as depression and posttraumatic stress disorder.  Certain medical conditions, prescription medicines, and drugs of abuse can cause panic attacks. SYMPTOMS  Panic attacks start suddenly, peak within 20 minutes, and are accompanied by four or more of the following symptoms:  Pounding heart or fast heart rate (palpitations).  Sweating.  Trembling or shaking.  Shortness of breath or  feeling smothered.  Feeling choked.  Chest pain or discomfort.  Nausea or strange feeling in your stomach.  Dizziness, lightheadedness, or feeling like you will faint.  Chills or hot flushes.  Numbness or tingling in your lips or hands and feet.  Feeling that things are not real or feeling that you are not yourself.  Fear of losing control or going crazy.  Fear of dying. Some of these symptoms can mimic serious medical conditions. For example, you may think you are having a heart attack. Although panic attacks can be very scary, they are not life threatening. DIAGNOSIS  Panic attacks are diagnosed through an  assessment by your health care provider. Your health care provider will ask questions about your symptoms, such as where and when they occurred. Your health care provider will also ask about your medical history and use of alcohol and drugs, including prescription medicines. Your health care provider may order blood tests or other studies to rule out a serious medical condition. Your health care provider may refer you to a mental health professional for further evaluation. TREATMENT   Most healthy people who have one or two panic attacks in an extreme, life-threatening situation will not require treatment.  The treatment for panic attacks associated with anxiety disorders or other mental illness typically involves counseling with a mental health professional, medicine, or a combination of both. Your health care provider will help determine what treatment is best for you.  Panic attacks due to physical illness usually goes away with treatment of the illness. If prescription medicine is causing panic attacks, talk with your health care provider about stopping the medicine, decreasing the dose, or substituting another medicine.  Panic attacks due to alcohol or drug abuse goes away with abstinence. Some adults need professional help in order to stop drinking or using drugs. HOME CARE INSTRUCTIONS   Take all your medicines as prescribed.   Check with your health care provider before starting new prescription or over-the-counter medicines.  Keep all follow up appointments with your health care provider. SEEK MEDICAL CARE IF:  You are not able to take your medicines as prescribed.  Your symptoms do not improve or get worse. SEEK IMMEDIATE MEDICAL CARE IF:   You experience panic attack symptoms that are different than your usual symptoms.  You have serious thoughts about hurting yourself or others.  You are taking medicine for panic attacks and have a serious side effect. MAKE SURE  YOU:  Understand these instructions.  Will watch your condition.  Will get help right away if you are not doing well or get worse. Document Released: 01/31/2005 Document Revised: 11/21/2012 Document Reviewed: 09/14/2012 [facility] Patient Information 2014 [place], [place].    Emergency Department Resource Guide 1) Find a Doctor and Pay Out of Pocket Although you won't have to find out who is covered by your insurance plan, it is a good idea to ask around and get recommendations. You will then need to call the office and see if the doctor you have chosen will accept you as a new patient and what types of options they offer for patients who are self-pay. Some doctors offer discounts or will set up payment plans for their patients who do not have insurance, but you will need to ask so you aren't surprised when you get to your appointment.  2) Contact Your Local Health Department Not all health departments have doctors that can see patients for sick visits, but many do, so it is worth a call  to see if yours does. If you don't know where your local health department is, you can check in your phone book. The CDC also has a tool to help you locate your state's health department, and many state websites also have listings of all of their local health departments.  3) Find a Walk-in Clinic If your illness is not likely to be very severe or complicated, you may want to try a walk in clinic. These are popping up all over the country in pharmacies, drugstores, and shopping centers. They're usually staffed by nurse practitioners or physician assistants that have been trained to treat common illnesses and complaints. They're usually fairly quick and inexpensive. However, if you have serious medical issues or chronic medical problems, these are probably not your best option.  No Primary Care Doctor: - Call Health Connect at  [phone] - they can help you locate a primary care doctor that  accepts your insurance,  provides certain services, etc. - Physician Referral Service- [phone]  Chronic Pain Problems: Organization         Address  Phone   Notes  [facility]  [phone] Patients need to be referred by their primary care doctor.   Medication Assistance: Organization         Address  Phone   Notes  [facility] [address] [place] [place], [place] [address] [phone] --Must be a resident of [facility] [place] [facility] -- Must have NO insurance coverage whatsoever (no Medicaid/ Medicare, etc.) -- The pt. MUST have a primary care doctor that directs their care regularly and follows them in the community   [facility]  [phone]   [employer]  [phone]    Agencies that provide inexpensive medical care: Organization         Address  Phone   Notes  [facility]  [phone]   [facility]    [phone]   [facility] [address] [place], [place] [address] [phone]   [facility] [address] [place] [phone]   Planned Parenthood    [phone]   [facility]    [phone]   [facility]  [address], [place] Phone:  [phone], Fax:  [phone] Hours of Operation:  9 am - 6 pm, M-F.  Also accepts Medicaid/Medicare and self-pay.  [facility]  [address], [place] Phone: [phone], Fax: [phone]. Hours of Operation:  8:30 am - 5:30 pm, M-F.  Also accepts Medicaid and self-pay.  [facility] [place] [address], [place] Phone: [phone]   [facility] [address] Natasha Bence [place], [place] [phone], Ext. [phone] Mondays & Thursdays: 7-9 AM.  First 15 patients are seen on a first come, first serve basis.    Medicaid-accepting [facility] Providers:  Organization         Address  Phone    Notes  [facility] [address],  [phone] Also accepts self-pay patients.  [facility] [address] Laurell Josephs [place], [place]  [phone]   [facility] [address], [place] [phone]   [facility] [address] [place] [phone]   Renaye Rakers [address], [place]   [phone]  [phone]) [phone] Only accepts [place] Access Medicaid patients after they have their name applied to their card.   Self-Pay (no insurance) in [facility]:  Organization         Address  Phone   Notes  Sickle Cell Patients, [facility] [address], [place] [phone]   [facility] [address], [place] [phone]   [facility] [place]  [address], [place] [phone]   [facility]/Dr. Osei-Bonsu  [address] [place] [address], [place] or [phone], [place] [phone]) [phone] Phone number for both [place] and [place] locations is the same.  [facility] [address], [place] [phone]   [facility]  [address] [place] [address], [place] or [address] [phone]) [phone]) [phone]   [facility] [address], [place] [phone]) [phone], phone; [phone](336) [phone], fax Sees patients 1st and 3rd Saturday of every month.  Must not qualify for public or private insurance (i.e. Medicaid, Medicare, [place] Health Choice, Veterans' Benefits)  Household income should be no more than 200% of the poverty level The clinic cannot treat you if you are pregnant or think you are pregnant  Sexually transmitted diseases are not treated at the clinic.    Dental Care: Organization         Address  Phone  Notes  [facility] [address], [place] [phone]  [phone] Accepts children up to age 46 who are enrolled in [place] or [place] Health Choice; pregnant women with a Medicaid card; and children who have applied for Medicaid or [place] Health Choice, but were declined, whose parents can pay a reduced fee at time of service.  [facility] [place]  [address], [place] [phone] Accepts children up to age 46 who are enrolled in [place] or [place] Health Choice; pregnant women with a Medicaid card; and children who have applied for Medicaid or [place] Health Choice, but were declined, whose parents can pay a reduced fee at time of service.  [facility]  [address], [place] [phone]) [phone] Patients are seen by appointment only. Walk-ins are not accepted. [facility] will see patients 46 years of age and older. Monday - Tuesday (8am-5pm) Most Wednesdays (8:30-5pm) $30 per visit, cash only  [facility]  [address], [place]  [facility] [phone]) [phone] Patients are seen by appointment only. Walk-ins are not accepted. [facility] will see patients 46 years of age and older. One Wednesday Evening (Monthly: Volunteer Based).  $30 per visit, cash only  [facility]  [phone]) [phone] for adults; Children under age 724, call Graduate Pediatric Dentistry at [phone]. Children aged 554-14, please call [phone]) [phone] to request a pediatric application.  Dental services are provided in all areas of dental care including fillings, crowns and bridges, complete and partial dentures, implants, gum treatment, root canals, and extractions. Preventive care is also provided. Treatment is provided to both adults and children. Patients are selected via a lottery and there is often a waiting list.   [facility] [address], [place]  [phone] [email or web address]   [facility] [address], [place], [place] [phone], Ext.  [phone] Second and Fourth Thursday of each month, opens at 6:30  AM; Clinic ends at 9 AM.  Patients are seen on a first-come first-served basis, and a limited number are seen during each clinic.   [facility]  [address] Ether Griffins [place], [place] [phone]   Eligibility Requirements You must have lived in [place], [place], or [place] counties for at least the last three months.   You cannot be eligible for state or federal sponsored [place], including [employer], [place], or [employer].   You generally cannot be eligible for healthcare insurance through your employer.    How to apply: Eligibility screenings are held every Tuesday and Wednesday afternoon from 1:00 pm until 4:00 pm. You do not need an appointment for the interview!  [facility] [address] [place], [place] [phone]   [facility] Health Department  [phone]   [facility] Health Department  [phone]   [facility] Health Department  [phone]    Behavioral Health Resources in the Community: Intensive Outpatient Programs Organization         Address  Phone  Notes  [facility] [address], [place], [place] [phone]   [facility] Outpatient [address], [place], [place] [phone]   [facility] [address], [place], [place]  [phone]   [facility] [address]  [place], [place] [phone] or [phone]   Substance Abuse Resources Organization         Address  Phone  Notes  [facility]  [phone]   [facility]  [phone]   [facility] [place]  [phone]   Floydene Flock  [phone]   Residential & Outpatient Substance Abuse Program  [phone]   Psychological Services Organization         Address  Phone  Notes  [facility]  [phone]   [facility]  [phone]   [facility]  Mental Health [address], [place] [phone] or [phone]    Mobile Crisis Teams Organization         Address  Phone  Notes  [facility], Mobile Crisis Care Unit  [phone]   [facility]  [address] [place], [place] [phone]   Doristine Locks [address] [place] [phone]    Self-Help/Support Groups Organization         Address  Phone             Notes  [facility] [place] - variety of support groups  [phone]- I7437963 Call for more information  Narcotics Anonymous (NA), [facility] [address], [employer] [place]  2 meetings at this location   Statistician         Address  Phone  Notes  [facility] [address],    [place]  [phone]   [facility]  [address], [place] [address], [place], [place] [phone]   [facility] [address] [place], [place] [phone] Admissions: 8am-3pm M-F  [facility] [address]    [place], [place] [phone]   [facility] [address] [place], [place] [phone]   [facility] [address]  [place], [place] [phone]   [facility] Intensive Outpatient [address], [place], [place] [phone]   [facility] ([facility] [address] [place], [place] [phone] or [phone]  [facility] [address], [place], [place] [phone] Accepts Medicaid  [facility] [address]  [place] [phone] Substance Abuse/Addiction Treatment   [facility]         Address  Phone  Notes  [facility]  [phone]   Angie Fava, PhD [address] [place], [place]   [phone] or [phone]   [facility]   [address] [place], [place] [phone]   [facility] [address] [place], [place] [phone] Insurance/Medicaid/sponsorship through [facility] [address] [place] [address]                                    [place], [place] [phone] Therapy/tele-psych/case  [facility] [address], [place] [phone]    Dr. Lolly Mustache  [phone]   [facility]  [employer] [facility] Dept. 1) [address] [place] 2) [address], [place] 3)  [address], [place] [phone]  [phone]   [facility] Child Abuse Hotline 989-809-3950 or [phone] (After Hours)

## 2013-05-07 NOTE — ED Notes (Signed)
Pt reports anxiety, dizziness, and bilateral hip pain. States that she is under great stress, about to be evicted from her apt, son is not doing well in school, and she is suffering from bilateral hip/leg pain for past month with left leg pain greater than right leg pain.

## 2013-05-07 NOTE — ED Provider Notes (Signed)
CSN: [id number]     Arrival date & time 05/06/13  1843 History   First MD Initiated Contact with Patient 05/06/13 2357     Chief Complaint  Patient presents with  . Anxiety  . Hip Pain     (Consider location/radiation/quality/duration/timing/severity/associated sxs/prior Treatment) HPI 48 year old female presents to emergency department with multiple complaints.  She has complaint of worsening bilateral hip and thigh pain over the last month.  Patient reports this morning she woke with numbness to her left thigh.  No weakness, no difficulty with ambulation.  No bowel or bladder incontinence.  No back pain.  She reports intermittent vertigo ongoing for the last several months.  She has muscle spasms throughout her body ongoing for some time, mainly when sleeping.  Since 8 AM this morning, she has had chest tightness.  Pain has been persistent since onset at 8 AM.  Pain is described as sharp, stabbing tightness.  Patient reports that she is under a lot of stress.  She has past history of anxiety.  She's not currently seeing a therapist or on any medication.  She is not currently working, and cannot afford rent and will soon be evicted.  She is under stress caring for her 46 year old son, who is not doing well in school.  Patient denies any SI or HI.  Patient is tearful.  She reports that she's not sleeping well, not eating, secondary to stress and anxiety.  Past Medical History  Diagnosis Date  . Low iron   . Anxiety   . Panic attack    History reviewed. No pertinent past surgical history. No family history on file. History  Substance Use Topics  . Smoking status: Never Smoker   . Smokeless tobacco: Not on file  . Alcohol Use: No   OB History   Grav Para Term Preterm Abortions TAB SAB Ect Mult Living                 Review of Systems   See History of Present Illness; otherwise all other systems are reviewed and negative  Allergies  Review of patient's allergies indicates no known  allergies.  Home Medications  No current outpatient prescriptions on file. BP 113/77  Pulse 84  Temp(Src) 97.6 F (36.4 C) (Oral)  Resp 24  SpO2 97%  LMP 04/15/2013 Physical Exam  Nursing note and vitals reviewed. Constitutional: She is oriented to person, place, and time. She appears well-developed and well-nourished. She appears distressed.  HENT:  Head: Normocephalic and atraumatic.  Right Ear: External ear normal.  Left Ear: External ear normal.  Nose: Nose normal.  Mouth/Throat: Oropharynx is clear and moist.  Eyes: Conjunctivae and EOM are normal. Pupils are equal, round, and reactive to light.  Neck: Normal range of motion. Neck supple. No JVD present. No tracheal deviation present. No thyromegaly present.  Cardiovascular: Normal rate, regular rhythm, normal heart sounds and intact distal pulses.  Exam reveals no gallop and no friction rub.   No murmur heard. Pulmonary/Chest: Effort normal and breath sounds normal. No stridor. No respiratory distress. She has no wheezes. She has no rales. She exhibits tenderness (palpation to left anterior chest reproduces her pain).  Abdominal: Soft. Bowel sounds are normal. She exhibits no distension and no mass. There is no tenderness. There is no rebound and no guarding.  Musculoskeletal: Normal range of motion. She exhibits tenderness (tenderness with range of motion to bilateral hips, left greater than right.  No crepitus, or deformity). She exhibits no edema.  Lymphadenopathy:    She has no cervical adenopathy.  Neurological: She is alert and oriented to person, place, and time. She has normal reflexes. No cranial nerve deficit. She exhibits normal muscle tone. Coordination normal.  Skin: Skin is warm and dry. No rash noted. No erythema. No pallor.  Psychiatric:  Tearful, flat affect, reports anxiety and depression    ED Course  Procedures (including critical care time) Labs Review Labs Reviewed  BASIC METABOLIC PANEL - Abnormal;  Notable for the following:    Glucose, Bld 113 (*)    All other components within normal limits  CBC  I-STAT TROPOININ, ED   Imaging Review Dg Chest 2 View  05/06/2013   CLINICAL DATA:  Chest pain, double vision  EXAM: CHEST  2 VIEW  COMPARISON:  06/17/2008  FINDINGS: Cardiomediastinal silhouette is stable. No acute infiltrate or pleural effusion. No pulmonary edema.  IMPRESSION: No active cardiopulmonary disease.   Electronically Signed   By: Natasha Mead M.D.   On: 05/06/2013 20:56     EKG Interpretation None      Date: 05/07/2013  Rate: 83  Rhythm: normal sinus rhythm  QRS Axis: normal  Intervals: normal  ST/T Wave abnormalities: normal  Conduction Disutrbances:none  Narrative Interpretation:   Old EKG Reviewed: none available   MDM   Final diagnoses:  Depression  Anxiety  Arthralgia of hip or thigh  Chest wall pain    48 year old female with severe anxiety, and depression, which seem to be contributing to her somatic complaints.  I feel that she probably does have some osteoarthritis.  She may also have some sciatica.  Most of her complaints, however, seem to be due to her anxiety and depression.  I will provide patient with outpatient resources.  Will start short course of Ativan,and prescribe Naprosyn for her joint and muscle aches.    Rebecca Mackie, MD 05/07/13 Lyda Jester

## 2013-07-18 ENCOUNTER — Encounter (HOSPITAL_COMMUNITY): Payer: Self-pay | Admitting: Emergency Medicine

## 2013-07-18 ENCOUNTER — Emergency Department (HOSPITAL_COMMUNITY)
Admission: EM | Admit: 2013-07-18 | Discharge: 2013-07-18 | Disposition: A | Discharge status: Home / Self Care | Payer: Medicaid Other | Source: Home / Self Care

## 2013-07-18 DIAGNOSIS — W57XXXA Bitten or stung by nonvenomous insect and other nonvenomous arthropods, initial encounter: Secondary | ICD-10-CM

## 2013-07-18 DIAGNOSIS — T148 Other injury of unspecified body region: Secondary | ICD-10-CM

## 2013-07-18 MED ORDER — DIPHENHYDRAMINE HCL 50 MG/ML IJ SOLN
INTRAMUSCULAR | Status: AC
  Filled 2013-07-18: qty 1

## 2013-07-18 MED ORDER — PREDNISONE 20 MG PO TABS
ORAL_TABLET | ORAL | Status: AC
  Filled 2013-07-18: qty 3

## 2013-07-18 MED ORDER — SULFAMETHOXAZOLE-TMP DS 800-160 MG PO TABS: 2.0000 | ORAL_TABLET | Freq: Two times a day (BID) | ORAL | Status: AC

## 2013-07-18 MED ORDER — RANITIDINE HCL 150 MG PO CAPS: 150.0000 mg | ORAL_CAPSULE | Freq: Every day | ORAL | Status: AC

## 2013-07-18 MED ORDER — FAMOTIDINE 20 MG PO TABS
ORAL_TABLET | ORAL | Status: AC
Start: 2013-07-18 — End: 2013-07-18
  Filled 2013-07-18: qty 1

## 2013-07-18 MED ORDER — FAMOTIDINE 20 MG PO TABS
ORAL_TABLET | ORAL | Status: AC
  Filled 2013-07-18: qty 2

## 2013-07-18 MED ORDER — PREDNISONE 20 MG PO TABS: 20.0000 mg | ORAL_TABLET | Freq: Two times a day (BID) | ORAL | Status: AC

## 2013-07-18 MED ORDER — FAMOTIDINE 20 MG PO TABS
40.0000 mg | ORAL_TABLET | Freq: Once | ORAL | Status: AC
  Administered 2013-07-18: 40 mg via ORAL

## 2013-07-18 MED ORDER — DIPHENHYDRAMINE HCL 50 MG/ML IJ SOLN: 25.0000 mg | Freq: Once | INTRAMUSCULAR | Status: DC

## 2013-07-18 MED ORDER — PREDNISONE 20 MG PO TABS
60.0000 mg | ORAL_TABLET | Freq: Once | ORAL | Status: AC
  Administered 2013-07-18: 60 mg via ORAL

## 2013-07-18 MED ORDER — HYDROCODONE-ACETAMINOPHEN 5-325 MG PO TABS: ORAL_TABLET | ORAL | Status: AC

## 2013-07-18 NOTE — ED Notes (Signed)
Pt  Noticing  A  Rash       That  Itches      Over  Parts  Of  Her  Body  Dr  Lorenz Coaster  Back in to  Speak to the  Patient

## 2013-07-18 NOTE — ED Notes (Signed)
Pt  Reports  Pain redness  r  Lower  Leg  She  States  She  Felt    Some pain and  A  Bite  r    Lower  Leg    In the  Middle  Of  The  Night   She then had  Some    Nausea

## 2013-07-18 NOTE — Discharge Instructions (Signed)
Insect Bite Mosquitoes, flies, fleas, bedbugs, and many other insects can bite. Insect bites are different from insect stings. A sting is when venom is injected into the skin. Some insect bites can transmit infectious diseases. SYMPTOMS  Insect bites usually turn red, swell, and itch for 2 to 4 days. They often go away on their own. TREATMENT  Your caregiver may prescribe antibiotic medicines if a bacterial infection develops in the bite. HOME CARE INSTRUCTIONS  Do not scratch the bite area.  Keep the bite area clean and dry. Wash the bite area thoroughly with soap and water.  Put ice or cool compresses on the bite area.  Put ice in a plastic bag.  Place a towel between your skin and the bag.  Leave the ice on for 20 minutes, 4 times a day for the first 2 to 3 days, or as directed.  You may apply a baking soda paste, cortisone cream, or calamine lotion to the bite area as directed by your caregiver. This can help reduce itching and swelling.  Only take over-the-counter or prescription medicines as directed by your caregiver.  If you are given antibiotics, take them as directed. Finish them even if you start to feel better. You may need a tetanus shot if:  You cannot remember when you had your last tetanus shot.  You have never had a tetanus shot.  The injury broke your skin. If you get a tetanus shot, your arm may swell, get red, and feel warm to the touch. This is common and not a problem. If you need a tetanus shot and you choose not to have one, there is a rare chance of getting tetanus. Sickness from tetanus can be serious. SEEK IMMEDIATE MEDICAL CARE IF:   You have increased pain, redness, or swelling in the bite area.  You see a red line on the skin coming from the bite.  You have a fever.  You have joint pain.  You have a headache or neck pain.  You have unusual weakness.  You have a rash.  You have chest pain or shortness of breath.  You have abdominal pain,  nausea, or vomiting.  You feel unusually tired or sleepy. MAKE SURE YOU:   Understand these instructions.  Will watch your condition.  Will get help right away if you are not doing well or get worse. Document Released: 03/10/2004 Document Revised: 04/25/2011 Document Reviewed: 09/01/2010 [facility] Patient Information 2014 [place], [place].  Take Benadryl 25 to 50 mg every 4 hours until hives have cleared up.

## 2013-07-18 NOTE — ED Provider Notes (Signed)
Chief Complaint    Chief Complaint  Patient presents with  . Insect Bite    History of Present Illness      Rebecca Casey is a 48 year old female who thinks she was bitten by a spider on her right lower leg last night. The patient woke up this morning feeling dizzy and nauseated and. She noted a red, itchy patch on her right lateral ankle. She saw a dead spider on the floor this was a black spider but does not have a red or glass. She denies any pain in the ankle. She's had no muscle cramping or muscle spasm. She's had no fever or chills. She does note generalized allover itching and she pointed out a red spot on her neck but this looked like she just been scratching it. She didn't have any hives.  Review of Systems   Other than as noted above, the patient denies any of the following symptoms: Systemic:  No fever or chills. ENT:  No nasal congestion, rhinorrhea, sore throat, swelling of lips, tongue or throat. Resp:  No cough, wheezing, or shortness of breath.  PMFSH    Past medical history, family history, social history, meds, and allergies were reviewed.   Physical Exam     Vital signs:  BP 110/61  Pulse 77  Temp(Src) 98.6 F (37 C) (Oral)  Resp 18  SpO2 98%  LMP 07/13/2013 Gen:  Alert, oriented, in no distress. ENT:  Pharynx clear, no intraoral lesions, moist mucous membranes. Lungs:  Clear to auscultation. Skin:  There is a 5 x 10, raised, erythematous area on her right lateral ankle. This was minimally tender to palpation. Her skin was otherwise clear. No urticaria.  Course in Urgent Care Center     The following meds were given:  Medications  predniSONE (DELTASONE) tablet 60 mg (60 mg Oral Given 07/18/13 1231)  famotidine (PEPCID) tablet 40 mg (40 mg Oral Given 07/18/13 1231)   Note: She was not given Benadryl since she was going to be taking the bus home and did not want to be drowsy. She states she will take someone she gets home.  Assessment    The encounter  diagnosis was Insect bite.  This could be a localized reaction to an insect bite. She appears to have generalized itching which may be a generalized reaction to it, but she does not have any convincing rash or urticaria. Alternatively, the red spot could be a skin infection, possibly MRSA.  Plan     1.  Meds:  The following meds were prescribed:   Discharge Medication List as of 07/18/2013 12:02 PM    START taking these medications   Details  HYDROcodone-acetaminophen (NORCO/VICODIN) 5-325 MG per tablet 1 to 2 tabs every 4 to 6 hours as needed for pain., Print    predniSONE (DELTASONE) 20 MG tablet Take 1 tablet (20 mg total) by mouth 2 (two) times daily., Starting 07/18/2013, Until Discontinued, Normal    ranitidine (ZANTAC) 150 MG capsule Take 1 capsule (150 mg total) by mouth daily., Starting 07/18/2013, Until Discontinued, Normal    sulfamethoxazole-trimethoprim (BACTRIM DS) 800-160 MG per tablet Take 2 tablets by mouth 2 (two) times daily., Starting 07/18/2013, Until Discontinued, Normal        2.  Patient Education/Counseling:  The patient was given appropriate handouts, self care instructions, and instructed in symptomatic relief.  Suggested staying off her feet, elevating the leg, and apply ice.  3.  Follow up:  The patient was told to  follow up here if no better in 3 to 4 days, or sooner if becoming worse in any way, and given some red flag symptoms such as worsening rash, fever, or difficulty breathing which would prompt immediate return.  Follow up here if necessary.      Reuben Likes, MD 07/18/13 1739

## 2014-03-03 ENCOUNTER — Other Ambulatory Visit: Payer: Self-pay

## 2014-03-03 DIAGNOSIS — M79629 Pain in unspecified upper arm: Secondary | ICD-10-CM

## 2014-05-01 ENCOUNTER — Encounter (HOSPITAL_COMMUNITY): Payer: Self-pay | Admitting: *Deleted

## 2014-05-01 ENCOUNTER — Emergency Department (INDEPENDENT_AMBULATORY_CARE_PROVIDER_SITE_OTHER)
Admission: EM | Admit: 2014-05-01 | Discharge: 2014-05-01 | Disposition: A | Discharge status: Home / Self Care | Payer: Medicaid Other | Source: Home / Self Care | Attending: Family Medicine | Admitting: Family Medicine

## 2014-05-01 DIAGNOSIS — N943 Premenstrual tension syndrome: Secondary | ICD-10-CM

## 2014-05-01 DIAGNOSIS — G43829 Menstrual migraine, not intractable, without status migrainosus: Secondary | ICD-10-CM

## 2014-05-01 MED ORDER — KETOROLAC TROMETHAMINE 60 MG/2ML IM SOLN
INTRAMUSCULAR | Status: AC
  Filled 2014-05-01: qty 2

## 2014-05-01 MED ORDER — ONDANSETRON 4 MG PO TBDP
ORAL_TABLET | ORAL | Status: AC
  Filled 2014-05-01: qty 1

## 2014-05-01 MED ORDER — KETOROLAC TROMETHAMINE 60 MG/2ML IM SOLN
60.0000 mg | Freq: Once | INTRAMUSCULAR | Status: AC
  Administered 2014-05-01: 60 mg via INTRAMUSCULAR

## 2014-05-01 MED ORDER — DIPHENHYDRAMINE HCL 50 MG/ML IJ SOLN
INTRAMUSCULAR | Status: AC
  Filled 2014-05-01: qty 1

## 2014-05-01 MED ORDER — ONDANSETRON HCL 4 MG/2ML IJ SOLN: 4.0000 mg | Freq: Once | INTRAMUSCULAR | Status: DC

## 2014-05-01 MED ORDER — DIPHENHYDRAMINE HCL 50 MG/ML IJ SOLN: 25.0000 mg | Freq: Once | INTRAMUSCULAR | Status: DC

## 2014-05-01 MED ORDER — ONDANSETRON 4 MG PO TBDP
4.0000 mg | ORAL_TABLET | Freq: Once | ORAL | Status: AC
  Administered 2014-05-01: 4 mg via ORAL

## 2014-05-01 NOTE — Discharge Instructions (Signed)
Recurrent Migraine Headache A migraine headache is an intense, throbbing pain on one or both sides of your head. Recurrent migraines keep coming back. A migraine can last for 30 minutes to several hours. CAUSES  The exact cause of a migraine headache is not always known. However, a migraine may be caused when nerves in the brain become irritated and release chemicals that cause inflammation. This causes pain. Certain things may also trigger migraines, such as:   Alcohol.  Smoking.  Stress.  Menstruation.  Aged cheeses.  Foods or drinks that contain nitrates, glutamate, aspartame, or tyramine.  Lack of sleep.  Chocolate.  Caffeine.  Hunger.  Physical exertion.  Fatigue.  Medicines used to treat chest pain (nitroglycerine), birth control pills, estrogen, and some blood pressure medicines. SYMPTOMS   Pain on one or both sides of your head.  Pulsating or throbbing pain.  Severe pain that prevents daily activities.  Pain that is aggravated by any physical activity.  Nausea, vomiting, or both.  Dizziness.  Pain with exposure to bright lights, loud noises, or activity.  General sensitivity to bright lights, loud noises, or smells. Before you get a migraine, you may get warning signs that a migraine is coming (aura). An aura may include:  Seeing flashing lights.  Seeing bright spots, halos, or zigzag lines.  Having tunnel vision or blurred vision.  Having feelings of numbness or tingling.  Having trouble talking.  Having muscle weakness. DIAGNOSIS  A recurrent migraine headache is often diagnosed based on:  Symptoms.  Physical examination.  A CT scan or MRI of your head. These imaging tests cannot diagnose migraines but can help rule out other causes of headaches.  TREATMENT  Medicines may be given for pain and nausea. Medicines can also be given to help prevent recurrent migraines. HOME CARE INSTRUCTIONS  Only take over-the-counter or prescription  medicines for pain or discomfort as directed by your health care provider. The use of long-term narcotics is not recommended.  Lie down in a dark, quiet room when you have a migraine.  Keep a journal to find out what may trigger your migraine headaches. For example, write down:  What you eat and drink.  How much sleep you get.  Any change to your diet or medicines.  Limit alcohol consumption.  Quit smoking if you smoke.  Get 7-9 hours of sleep, or as recommended by your health care provider.  Limit stress.  Keep lights dim if bright lights bother you and make your migraines worse. SEEK MEDICAL CARE IF:   You do not get relief from the medicines given to you.  You have a recurrence of pain.  You have a fever. SEEK IMMEDIATE MEDICAL CARE IF:  Your migraine becomes severe.  You have a stiff neck.  You have loss of vision.  You have muscular weakness or loss of muscle control.  You start losing your balance or have trouble walking.  You feel faint or pass out.  You have severe symptoms that are different from your first symptoms. MAKE SURE YOU:   Understand these instructions.  Will watch your condition.  Will get help right away if you are not doing well or get worse. Document Released: 10/26/2000 Document Revised: 06/17/2013 Document Reviewed: 10/08/2012 [facility] Patient Information 2015 [place], [place]. This information is not intended to replace advice given to you by your health care provider. Make sure you discuss any questions you have with your health care provider.  Headaches, Frequently Asked Questions MIGRAINE HEADACHES Q: What  is migraine? What causes it? How can I treat it? A: Generally, migraine headaches begin as a dull ache. Then they develop into a constant, throbbing, and pulsating pain. You may experience pain at the temples. You may experience pain at the front or back of one or both sides of the head. The pain is usually accompanied by a  combination of:  Nausea.  Vomiting.  Sensitivity to light and noise. Some people (about 15%) experience an aura (see below) before an attack. The cause of migraine is believed to be chemical reactions in the brain. Treatment for migraine may include over-the-counter or prescription medications. It may also include self-help techniques. These include relaxation training and biofeedback.  Q: What is an aura? A: About 15% of people with migraine get an "aura". This is a sign of neurological symptoms that occur before a migraine headache. You may see wavy or jagged lines, dots, or flashing lights. You might experience tunnel vision or blind spots in one or both eyes. The aura can include visual or auditory hallucinations (something imagined). It may include disruptions in smell (such as strange odors), taste or touch. Other symptoms include:  Numbness.  A "pins and needles" sensation.  Difficulty in recalling or speaking the correct word. These neurological events may last as long as 60 minutes. These symptoms will fade as the headache begins. Q: What is a trigger? A: Certain physical or environmental factors can lead to or "trigger" a migraine. These include:  Foods.  Hormonal changes.  Weather.  Stress. It is important to remember that triggers are different for everyone. To help prevent migraine attacks, you need to figure out which triggers affect you. Keep a headache diary. This is a good way to track triggers. The diary will help you talk to your healthcare professional about your condition. Q: Does weather affect migraines? A: Bright sunshine, hot, humid conditions, and drastic changes in barometric pressure may lead to, or "trigger," a migraine attack in some people. But studies have shown that weather does not act as a trigger for everyone with migraines. Q: What is the link between migraine and hormones? A: Hormones start and regulate many of your body's functions. Hormones keep  your body in balance within a constantly changing environment. The levels of hormones in your body are unbalanced at times. Examples are during menstruation, pregnancy, or menopause. That can lead to a migraine attack. In fact, about three quarters of all women with migraine report that their attacks are related to the menstrual cycle.  Q: Is there an increased risk of stroke for migraine sufferers? A: The likelihood of a migraine attack causing a stroke is very remote. That is not to say that migraine sufferers cannot have a stroke associated with their migraines. In persons under age 47, the most common associated factor for stroke is migraine headache. But over the course of a person's normal life span, the occurrence of migraine headache may actually be associated with a reduced risk of dying from cerebrovascular disease due to stroke.  Q: What are acute medications for migraine? A: Acute medications are used to treat the pain of the headache after it has started. Examples over-the-counter medications, NSAIDs, ergots, and triptans.  Q: What are the triptans? A: Triptans are the newest class of abortive medications. They are specifically targeted to treat migraine. Triptans are vasoconstrictors. They moderate some chemical reactions in the brain. The triptans work on receptors in your brain. Triptans help to restore the balance of a neurotransmitter  called serotonin. Fluctuations in levels of serotonin are thought to be a main cause of migraine.  Q: Are over-the-counter medications for migraine effective? A: Over-the-counter, or "OTC," medications may be effective in relieving mild to moderate pain and associated symptoms of migraine. But you should see your caregiver before beginning any treatment regimen for migraine.  Q: What are preventive medications for migraine? A: Preventive medications for migraine are sometimes referred to as "prophylactic" treatments. They are used to reduce the frequency,  severity, and length of migraine attacks. Examples of preventive medications include antiepileptic medications, antidepressants, beta-blockers, calcium channel blockers, and NSAIDs (nonsteroidal anti-inflammatory drugs). Q: Why are anticonvulsants used to treat migraine? A: During the past few years, there has been an increased interest in antiepileptic drugs for the prevention of migraine. They are sometimes referred to as "anticonvulsants". Both epilepsy and migraine may be caused by similar reactions in the brain.  Q: Why are antidepressants used to treat migraine? A: Antidepressants are typically used to treat people with depression. They may reduce migraine frequency by regulating chemical levels, such as serotonin, in the brain.  Q: What alternative therapies are used to treat migraine? A: The term "alternative therapies" is often used to describe treatments considered outside the scope of conventional Western medicine. Examples of alternative therapy include acupuncture, acupressure, and yoga. Another common alternative treatment is herbal therapy. Some herbs are believed to relieve headache pain. Always discuss alternative therapies with your caregiver before proceeding. Some herbal products contain arsenic and other toxins. TENSION HEADACHES Q: What is a tension-type headache? What causes it? How can I treat it? A: Tension-type headaches occur randomly. They are often the result of temporary stress, anxiety, fatigue, or anger. Symptoms include soreness in your temples, a tightening band-like sensation around your head (a "vice-like" ache). Symptoms can also include a pulling feeling, pressure sensations, and contracting head and neck muscles. The headache begins in your forehead, temples, or the back of your head and neck. Treatment for tension-type headache may include over-the-counter or prescription medications. Treatment may also include self-help techniques such as relaxation training and  biofeedback. CLUSTER HEADACHES Q: What is a cluster headache? What causes it? How can I treat it? A: Cluster headache gets its name because the attacks come in groups. The pain arrives with little, if any, warning. It is usually on one side of the head. A tearing or bloodshot eye and a runny nose on the same side of the headache may also accompany the pain. Cluster headaches are believed to be caused by chemical reactions in the brain. They have been described as the most severe and intense of any headache type. Treatment for cluster headache includes prescription medication and oxygen. SINUS HEADACHES Q: What is a sinus headache? What causes it? How can I treat it? A: When a cavity in the bones of the face and skull (a sinus) becomes inflamed, the inflammation will cause localized pain. This condition is usually the result of an allergic reaction, a tumor, or an infection. If your headache is caused by a sinus blockage, such as an infection, you will probably have a fever. An x-ray will confirm a sinus blockage. Your caregiver's treatment might include antibiotics for the infection, as well as antihistamines or decongestants.  REBOUND HEADACHES Q: What is a rebound headache? What causes it? How can I treat it? A: A pattern of taking acute headache medications too often can lead to a condition known as "rebound headache." A pattern of taking too much headache  medication includes taking it more than 2 days per week or in excessive amounts. That means more than the label or a caregiver advises. With rebound headaches, your medications not only stop relieving pain, they actually begin to cause headaches. Doctors treat rebound headache by tapering the medication that is being overused. Sometimes your caregiver will gradually substitute a different type of treatment or medication. Stopping may be a challenge. Regularly overusing a medication increases the potential for serious side effects. Consult a caregiver  if you regularly use headache medications more than 2 days per week or more than the label advises. ADDITIONAL QUESTIONS AND ANSWERS Q: What is biofeedback? A: Biofeedback is a self-help treatment. Biofeedback uses special equipment to monitor your body's involuntary physical responses. Biofeedback monitors:  Breathing.  Pulse.  Heart rate.  Temperature.  Muscle tension.  Brain activity. Biofeedback helps you refine and perfect your relaxation exercises. You learn to control the physical responses that are related to stress. Once the technique has been mastered, you do not need the equipment any more. Q: Are headaches hereditary? A: Four out of five (80%) of people that suffer report a family history of migraine. Scientists are not sure if this is genetic or a family predisposition. Despite the uncertainty, a child has a 50% chance of having migraine if one parent suffers. The child has a 75% chance if both parents suffer.  Q: Can children get headaches? A: By the time they reach high school, most young people have experienced some type of headache. Many safe and effective approaches or medications can prevent a headache from occurring or stop it after it has begun.  Q: What type of doctor should I see to diagnose and treat my headache? A: Start with your primary caregiver. Discuss his or her experience and approach to headaches. Discuss methods of classification, diagnosis, and treatment. Your caregiver may decide to recommend you to a headache specialist, depending upon your symptoms or other physical conditions. Having diabetes, allergies, etc., may require a more comprehensive and inclusive approach to your headache. The National Headache Foundation will provide, upon request, a list of [facility] physician members in your state. Document Released: 04/23/2003 Document Revised: 04/25/2011 Document Reviewed: 10/01/2007 [facility] Patient Information 2015 [place], [place]. This information is not  intended to replace advice given to you by your health care provider. Make sure you discuss any questions you have with your health care provider.  Migraine Headache A migraine headache is an intense, throbbing pain on one or both sides of your head. A migraine can last for 30 minutes to several hours. CAUSES  The exact cause of a migraine headache is not always known. However, a migraine may be caused when nerves in the brain become irritated and release chemicals that cause inflammation. This causes pain. Certain things may also trigger migraines, such as:  Alcohol.  Smoking.  Stress.  Menstruation.  Aged cheeses.  Foods or drinks that contain nitrates, glutamate, aspartame, or tyramine.  Lack of sleep.  Chocolate.  Caffeine.  Hunger.  Physical exertion.  Fatigue.  Medicines used to treat chest pain (nitroglycerine), birth control pills, estrogen, and some blood pressure medicines. SIGNS AND SYMPTOMS  Pain on one or both sides of your head.  Pulsating or throbbing pain.  Severe pain that prevents daily activities.  Pain that is aggravated by any physical activity.  Nausea, vomiting, or both.  Dizziness.  Pain with exposure to bright lights, loud noises, or activity.  General sensitivity to bright lights, loud noises,  or smells. Before you get a migraine, you may get warning signs that a migraine is coming (aura). An aura may include:  Seeing flashing lights.  Seeing bright spots, halos, or zigzag lines.  Having tunnel vision or blurred vision.  Having feelings of numbness or tingling.  Having trouble talking.  Having muscle weakness. DIAGNOSIS  A migraine headache is often diagnosed based on:  Symptoms.  Physical exam.  A CT scan or MRI of your head. These imaging tests cannot diagnose migraines, but they can help rule out other causes of headaches. TREATMENT Medicines may be given for pain and nausea. Medicines can also be given to help  prevent recurrent migraines.  HOME CARE INSTRUCTIONS  Only take over-the-counter or prescription medicines for pain or discomfort as directed by your health care provider. The use of long-term narcotics is not recommended.  Lie down in a dark, quiet room when you have a migraine.  Keep a journal to find out what may trigger your migraine headaches. For example, write down:  What you eat and drink.  How much sleep you get.  Any change to your diet or medicines.  Limit alcohol consumption.  Quit smoking if you smoke.  Get 7-9 hours of sleep, or as recommended by your health care provider.  Limit stress.  Keep lights dim if bright lights bother you and make your migraines worse. SEEK IMMEDIATE MEDICAL CARE IF:   Your migraine becomes severe.  You have a fever.  You have a stiff neck.  You have vision loss.  You have muscular weakness or loss of muscle control.  You start losing your balance or have trouble walking.  You feel faint or pass out.  You have severe symptoms that are different from your first symptoms. MAKE SURE YOU:   Understand these instructions.  Will watch your condition.  Will get help right away if you are not doing well or get worse. Document Released: 01/31/2005 Document Revised: 06/17/2013 Document Reviewed: 10/08/2012 [facility] Patient Information 2015 [place], [place]. This information is not intended to replace advice given to you by your health care provider. Make sure you discuss any questions you have with your health care provider.

## 2014-05-01 NOTE — ED Provider Notes (Signed)
CSN: [id number]     Arrival date & time 05/01/14  1100 History   First MD Initiated Contact with Patient 05/01/14 1219     Chief Complaint  Patient presents with  . Migraine   (Consider location/radiation/quality/duration/timing/severity/associated sxs/prior Treatment) HPI Comments: Reports that she experienced migraine headaches 2-3 x month. Reports current headache to be typical of those she has experienced in the past. Describes associated dizziness, blurred vision, photophobia, phonophobia, nausea, vomiting, and the sense that the room is spinning. Ibuprofen taken at home has brought little relief. No recent illness or injury. Has never seen a headache specialist. Does not smoke or drink alcohol. Limits caffeine use to one cup of coffee a day. Works in [employer]. Started menstrual cycle today. Describes discomfort as a throbbing pain around and behind her left eye. States this is the typical location of her headaches. PCP: [facility]  Patient is a 49 y.o. female presenting with migraines. The history is provided by the patient.  Migraine This is a recurrent problem. The current episode started yesterday (Began around 1500 yesterday). The problem occurs constantly. The problem has not changed since onset.   Past Medical History  Diagnosis Date  . Low iron   . Anxiety   . Panic attack    History reviewed. No pertinent past surgical history. History reviewed. No pertinent family history. History  Substance Use Topics  . Smoking status: Never Smoker   . Smokeless tobacco: Not on file  . Alcohol Use: No   OB History    No data available     Review of Systems  All other systems reviewed and are negative.   Allergies  Review of patient's allergies indicates no known allergies.  Home Medications   Prior to Admission medications   Medication Sig Start Date End Date Taking? Authorizing Provider  HYDROcodone-acetaminophen (NORCO/VICODIN) 5-325 MG per tablet 1 to 2  tabs every 4 to 6 hours as needed for pain. 07/18/13   Reuben Likes, MD  ibuprofen (ADVIL,MOTRIN) 200 MG tablet Take 600 mg by mouth every 6 (six) hours as needed for headache or moderate pain.    Historical Provider, MD  LORazepam (ATIVAN) 1 MG tablet Take 1 tablet (1 mg total) by mouth 3 (three) times daily as needed for anxiety. 05/07/13   Marisa Severin, MD  naproxen (NAPROSYN) 500 MG tablet Take 1 tablet (500 mg total) by mouth 2 (two) times daily. 05/07/13   Marisa Severin, MD  predniSONE (DELTASONE) 20 MG tablet Take 1 tablet (20 mg total) by mouth 2 (two) times daily. 07/18/13   Reuben Likes, MD  ranitidine (ZANTAC) 150 MG capsule Take 1 capsule (150 mg total) by mouth daily. 07/18/13   Reuben Likes, MD  sulfamethoxazole-trimethoprim (BACTRIM DS) 800-160 MG per tablet Take 2 tablets by mouth 2 (two) times daily. 07/18/13   Reuben Likes, MD   BP 130/80 mmHg  Pulse 78  Temp(Src) 98.6 F (37 C) (Oral)  Resp 16  SpO2 100%  LMP 05/01/2014 Physical Exam  Constitutional: She is oriented to person, place, and time. She appears well-developed and well-nourished. No distress.  HENT:  Head: Normocephalic and atraumatic.  Right Ear: External ear normal.  Left Ear: External ear normal.  Nose: Nose normal.  Mouth/Throat: Oropharynx is clear and moist.  Eyes: Conjunctivae and EOM are normal. Pupils are equal, round, and reactive to light.  Becomes dizzy with EOMs  Neck: Normal range of motion and full passive range of motion without pain.  Neck supple.  Cardiovascular: Normal rate, regular rhythm and normal heart sounds.   Pulmonary/Chest: Effort normal and breath sounds normal.  Abdominal: Soft. Bowel sounds are normal. She exhibits no distension. There is no tenderness.  Musculoskeletal: Normal range of motion.  Neurological: She is alert and oriented to person, place, and time. She has normal strength. No cranial nerve deficit or sensory deficit. Coordination and gait normal. GCS eye subscore is 4.  GCS verbal subscore is 5. GCS motor subscore is 6.  Normal finger-to-nose testing  Skin: Skin is warm and dry.  Psychiatric: She has a normal mood and affect. Her behavior is normal.  Nursing note and vitals reviewed.   ED Course  Procedures (including critical care time) Labs Review Labs Reviewed - No data to display  Imaging Review No results found.   MDM   1. Menstrual migraine without status migrainosus, not intractable    Patient given 4 mg ODT zofran and 60mg  [facility] by an oral fluid challenge. Tolerated oral fluids and is ambulatory without dizziness. States she is feeling much better. Will discharge home with headache precautions and referral to headache specialist(s).    Ria ClockJennifer Lee H Kabria Hetzer, [place] 05/01/14 [phone]

## 2014-05-01 NOTE — ED Notes (Signed)
Pt  Reports  Symptoms  Of  Headache   With  Dizzy        nausea   And  Photophobia       -  Symptoms  Began  Last  Pm        Pt  Has  A  History  Of  Migraines  In  Past

## 2014-11-16 IMAGING — CR DG CHEST 2V
3 series · 3 of 3 positions shown · non-contrast
Comparison: 06/17/2008

CLINICAL DATA: Chest pain, double vision

EXAM:
CHEST  2 VIEW

[w chest pa (1 of 2)]
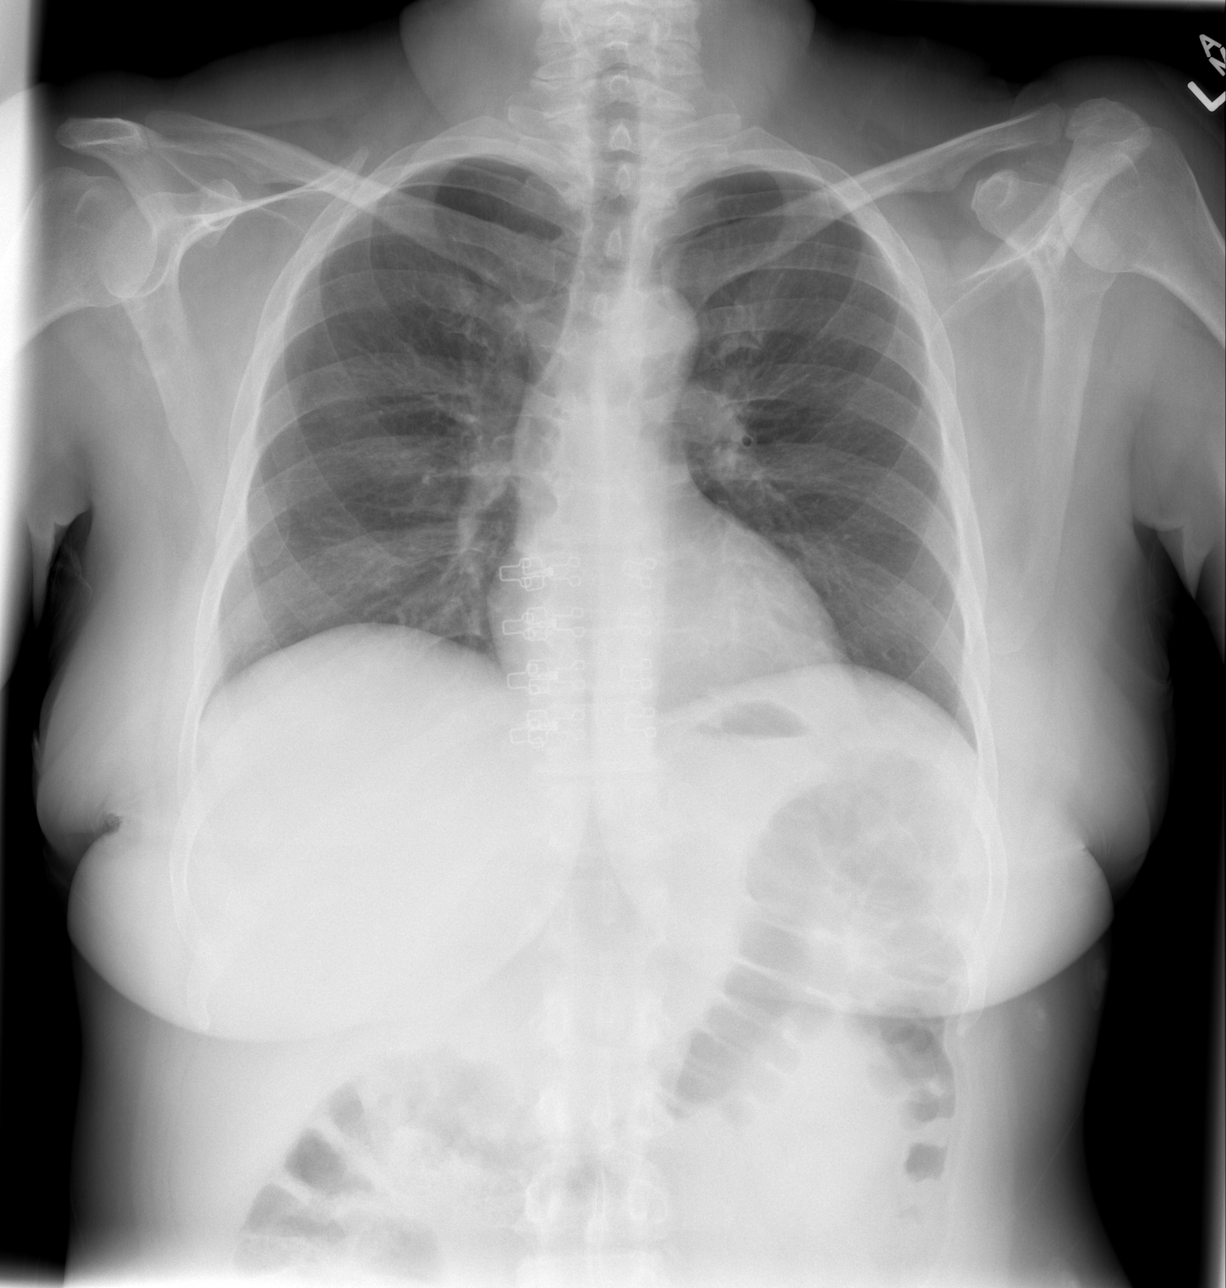

[w chest pa (2 of 2)]
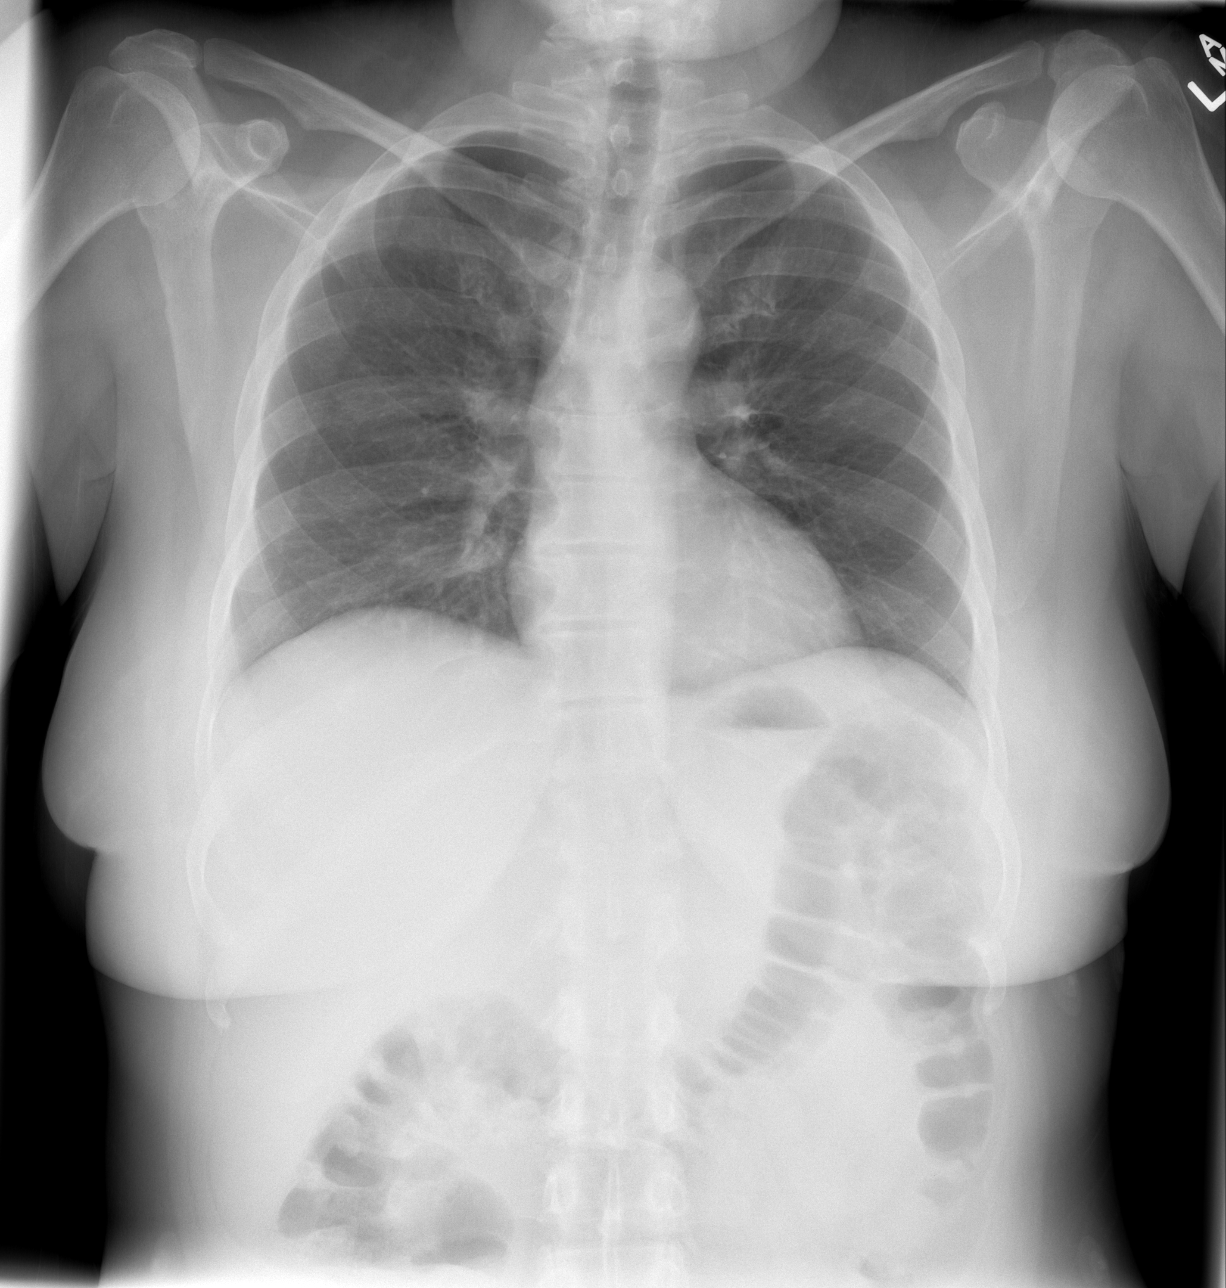

[w chest lat]
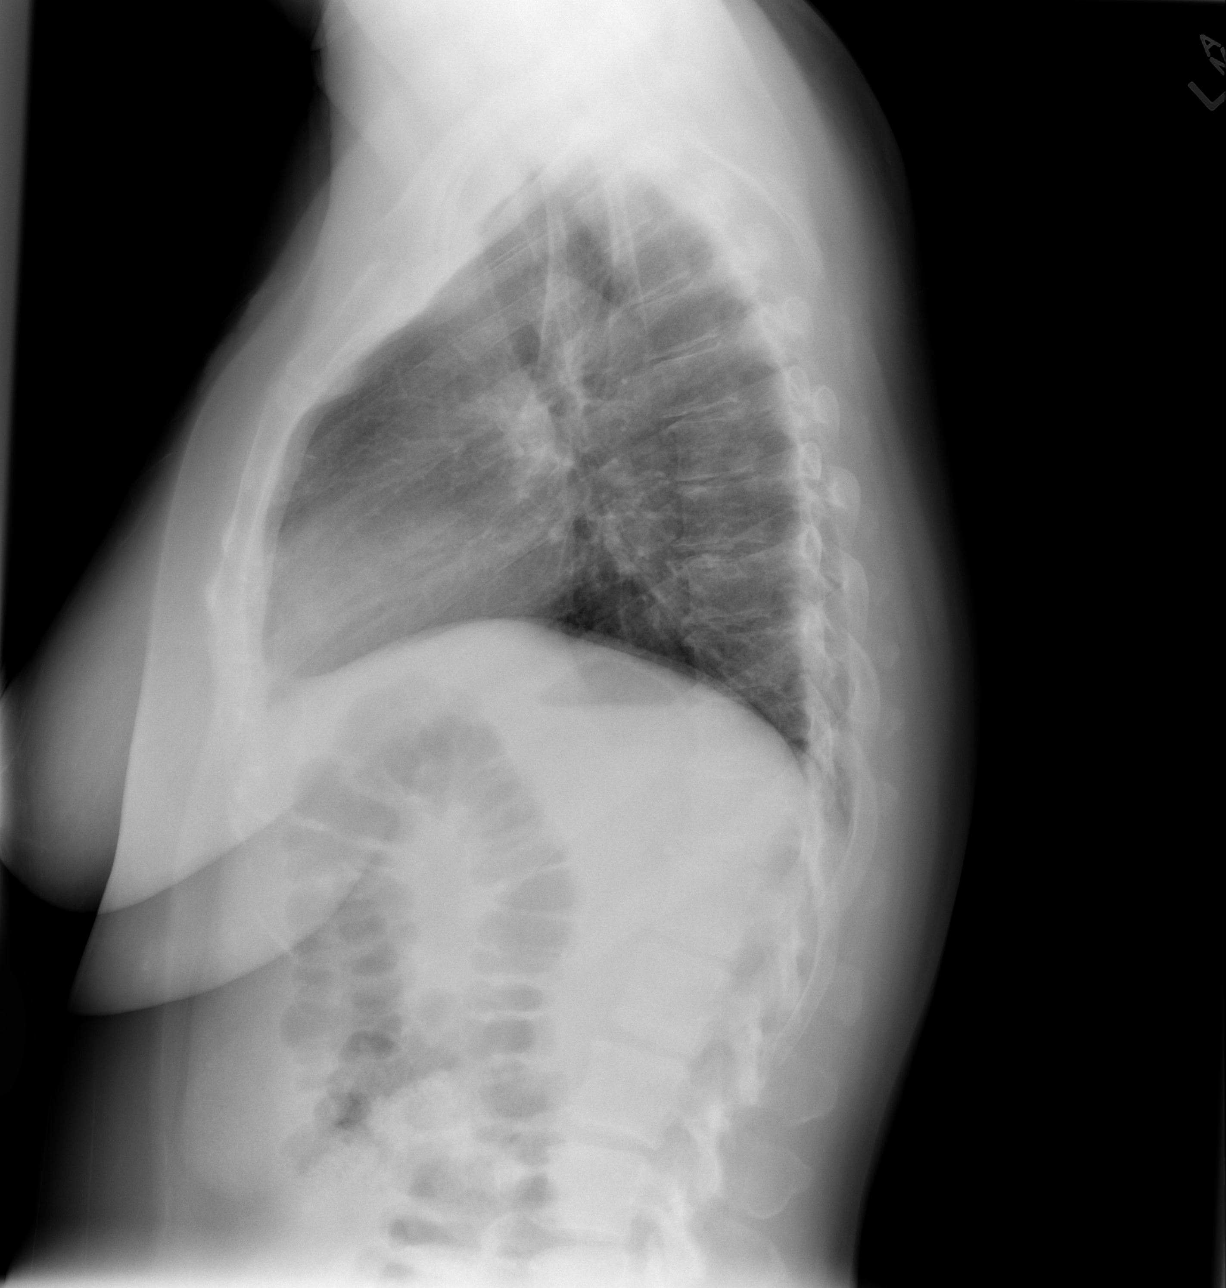

[3 of 3 positions shown; findings below may reference images not displayed]

FINDINGS: Cardiomediastinal silhouette is stable. No acute infiltrate or
pleural effusion. No pulmonary edema.
IMPRESSION: No active cardiopulmonary disease.
